# Patient Record
Sex: Male | Born: 1945 | Race: Black or African American | Hispanic: No | Smoking: Former smoker
Health system: Southern US, Community
[De-identification: ages and names within clinical notes are randomized; demographics above are authoritative.]

## PROBLEM LIST (undated history)

## (undated) DIAGNOSIS — E559 Vitamin D deficiency, unspecified: Secondary | ICD-10-CM

## (undated) DIAGNOSIS — E119 Type 2 diabetes mellitus without complications: Secondary | ICD-10-CM

## (undated) DIAGNOSIS — N410 Acute prostatitis: Secondary | ICD-10-CM

## (undated) DIAGNOSIS — I10 Essential (primary) hypertension: Secondary | ICD-10-CM

## (undated) DIAGNOSIS — R369 Urethral discharge, unspecified: Secondary | ICD-10-CM

## (undated) DIAGNOSIS — F419 Anxiety disorder, unspecified: Secondary | ICD-10-CM

## (undated) HISTORY — DX: Type 2 diabetes mellitus without complications: E11.9

## (undated) HISTORY — DX: Essential (primary) hypertension: I10

## (undated) HISTORY — DX: Anxiety disorder, unspecified: F41.9

## (undated) HISTORY — DX: Urethral discharge, unspecified: R36.9

## (undated) HISTORY — DX: Acute prostatitis: N41.0

## (undated) HISTORY — PX: WISDOM TOOTH EXTRACTION: SHX21

## (undated) HISTORY — PX: ROTATOR CUFF REPAIR: SHX139

## (undated) HISTORY — DX: Vitamin D deficiency, unspecified: E55.9

---

## 1999-06-18 ENCOUNTER — Encounter: Payer: Self-pay | Admitting: Internal Medicine

## 1999-06-18 ENCOUNTER — Encounter: Admission: RE | Admit: 1999-06-18 | Discharge: 1999-06-18 | Payer: Self-pay | Admitting: Internal Medicine

## 2000-03-08 ENCOUNTER — Encounter: Admission: RE | Admit: 2000-03-08 | Discharge: 2000-03-08 | Payer: Self-pay | Admitting: Internal Medicine

## 2000-03-08 ENCOUNTER — Encounter: Payer: Self-pay | Admitting: Internal Medicine

## 2002-02-13 ENCOUNTER — Encounter: Payer: Self-pay | Admitting: Internal Medicine

## 2002-02-13 ENCOUNTER — Encounter: Admission: RE | Admit: 2002-02-13 | Discharge: 2002-02-13 | Payer: Self-pay | Admitting: Internal Medicine

## 2004-10-26 ENCOUNTER — Encounter (INDEPENDENT_AMBULATORY_CARE_PROVIDER_SITE_OTHER): Payer: Self-pay | Admitting: *Deleted

## 2004-10-26 ENCOUNTER — Ambulatory Visit (HOSPITAL_BASED_OUTPATIENT_CLINIC_OR_DEPARTMENT_OTHER): Admission: RE | Admit: 2004-10-26 | Discharge: 2004-10-26 | Payer: Self-pay | Admitting: Otolaryngology

## 2004-10-26 ENCOUNTER — Ambulatory Visit (HOSPITAL_COMMUNITY): Admission: RE | Admit: 2004-10-26 | Discharge: 2004-10-26 | Payer: Self-pay | Admitting: Otolaryngology

## 2008-03-24 ENCOUNTER — Inpatient Hospital Stay (HOSPITAL_COMMUNITY): Admission: EM | Admit: 2008-03-24 | Discharge: 2008-03-25 | Payer: Self-pay | Admitting: Emergency Medicine

## 2010-11-27 ENCOUNTER — Encounter: Payer: Self-pay | Admitting: Internal Medicine

## 2011-01-01 NOTE — Op Note (Signed)
NAME:  Jonathan Schultz, LIKINS NO.:  [id number]   MEDICAL RECORD NO.:  [id number]          PATIENT TYPE:  AMB   LOCATION:  DSC                          FACILITY:  [facility]   PHYSICIAN:  Karol T. Lazarus Salines, M.D. 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   PROCEDURE PERFORMED:  Nasal septoplasty, bilateral submucous resection of  inferior turbinates, left maxillary endoscopic antrostomy with removal of  sinus contents.   SURGEON:  Gloris Manchester. Lazarus Salines, M.D.   ANESTHESIA:  General orotracheal.   BLOOD LOSS:  Minimal.   COMPLICATIONS:  None.   FINDINGS:  Overall thick corrugated septum deviated towards the left. Very  large bony inferior turbinates. A large thin-walled serous retention cyst in  the left antrum.   PROCEDURE:  With the patient in the comfortable supine position, general  orotracheal anesthesia was induced without difficulty. At an appropriate  level, the patient was placed in a slight sitting position. A saline  moistened throat pack was placed. Nasal vibrissae were trimmed. Cocaine  crystals, 200 milligrams total were applied on cotton carriers to the  anterior ethmoid and sphenopalatine ganglion regions on both sides.  Xylocaine plus phenylephrine solution was applied on 1/2 x 3 inch cottonoids  to both sides of the septum. 1% Xylocaine with 1:100,000 epinephrine, 8 cc  total was infiltrated into the anterior floor of the nose on both sides,  into the nasal spine region, into the membranous columella, and into the  submucoperichondrial plane of the septum on both sides. A sterile  preparation and draping of the midface  was accomplished.   The CT scans were reviewed. The materials were removed from the nose,  observed to be intact and correct in number. Findings were as described  above. A small floor incision was executed on the left side and a floor  tunnel was developed over towards the vomer and brought forward. On the  right side, a hemitransfixion incision was executed and carried down to the  floor incision. Once again a floor tunnel was elevated and carried over to  the vomer and brought forward. The sub mucoperichondrial plane of the right  septum was elevated, carried back to the perpendicular plate of ethmoid,  carried  inferiorly to communicate with the floor tunnel and then brought  forward. The right septal flap had a small inferior rent but otherwise was  raised intact.   The chondroethmoid junction was identified and opened with the Cottle  elevator. The opposite submucoperiosteal plane was dissected. The superior  perpendicular plate was lysed with an open Jansen-Middleton forceps. The  inferior portion was dissected and rocked free and delivered. Additional  bony spicules were carefully removed. At this point, the posterior inferior  corner of the quadrangular cartilage was submucosally resected and a 2 mm  strip  along the maxillary crest where it was heavily spurred was also  resected, allowing it to trapdoor into the midline.  It tended to veer  towards the left. An incision separating the septum from the upper lateral  cartilages was made on both sides of the nose.  At this point the septum was  mobile and in the midline. Hemostasis was observed. Septum was secured to  the nasal spine with a figure-of-eight 4-0 PDS suture. The tunnels were  carefully suctioned free. The incisions were closed with interrupted 4-0  chromic sutures.   Upon completing the septoplasty, the lateral wall of the middle meatus and  the middle turbinate on the left side were infiltrated with a small amount   of 1% Xylocaine with 1:100,000 epinephrine using a 25 gauge spinal needle.  Another 6 cc of the same agent was used to infiltrate the inferior  turbinates on each side.   The middle turbinate was medialized. Inspection with the 0 degree endoscope  revealed the findings as described above. Sickle knife was used to incise  the base of the uncinate process which was then removed with the power  debrider. An accessory ostium was observed and this was enlarged in a  posterior direction using a Gruenwald forceps and then in an anterior  direction using the backbiting forceps. The flag forceps was used to clean  the inferior edge of the antrostomy. The debrider was used to remove loose  pieces. What appeared to be a punctured cyst was identified in the antrum  and as much of the cyst wall as possible was evacuated using the debrider,  flag forceps, goose-necked forceps, and the curved suction tip. A 30 degrees  endoscope was used to better visualize the antrum. No other findings were  noted in the antrum. Hemostasis was observed. The mucosal and bony edges  were cleaned up and this was completed.   Beginning on the right side, the anterior hood of the inferior turbinate was  sharply lysed just behind the nasal valve region. The medial mucosa of the  turbinate was incised in an anterior upsloping fashion and a laterally based  flap was developed. The turbinate was infractured. Using angled turbinate  scissors, the turbinate bone and lateral mucosa were resected in a posterior  downsloping fashion taking virtually all of the anterior pole and leaving  virtually all the posterior pole. Additional slivers of bone were carefully  submucosally dissected and removed to allow more prompt healing. The bulbous  posterior pole of the inferior turbinate and cut mucosal edges were suction coagulated. The turbinate was outfractured. The flap was laid back down and  this side was completed. The left side was  done in identical fashion.   At this point,  0.040 reinforced Silastic splints were fashioned and placed  against the septum and secured thereto with a 3-0 nylon stitch. The triple  thickness Telfa pack impregnated with bacitracin ointment and with a 2-0  silk tag was placed into the left middle meatus for hemostasis. Similar  triple thickness Telfa packs were placed along the inferior turbinates on  each side.   The pharynx was suctioned free and throat pack was removed. Hemostasis was  observed. The patient was returned to Anesthesia, awakened, extubated, and  transferred to recovery in stable condition.   COMMENT:  A 65 year old black male with a distant history of nasal trauma  and long history of poor breathing through his nose with additional history  of recurrent left maxillary sinus infections  and persistent opacification  consistent with a maxiallry sinus cyst was the indication for today's  procedure. Anticipate routine postoperative  recovery with attention to ice, elevation, analgesia, antibiosis. Will  remove the nasal packs in one day and begin nasal hygiene measures. Will  remove the nasal splints in 10 days. Given low anticipated risk of post  anesthetic or postsurgical complications I feel an outpatient venue is  appropriate.      KTW/MEDQ  D:  10/26/2004  T:  10/26/2004  Job:  [id number]   cc:   Minerva Areola L. August Saucer, M.D.  [address] [address]  [place]  [place] [address]  Fax: [phone]

## 2011-01-01 NOTE — Discharge Summary (Signed)
NAME:  Jonathan Schultz, Jonathan Schultz NO.:  [id number]   MEDICAL RECORD NO.:  [id number]          PATIENT TYPE:  INP   LOCATION:  4729                         FACILITY:  [facility]   PHYSICIAN:  Ricki Rodriguez, M.D.  DATE OF BIRTH:  20-May-1946   DATE OF ADMISSION:  03/24/2008  DATE OF DISCHARGE:  03/25/2008                               DISCHARGE SUMMARY   Patient referred by Dr. Willey Blade.   HOSPITAL LOCATION:  4729, bed 2.   FINAL DIAGNOSES:  1. Chest pain.  2. Hypertension.  3. Hyperlipidemia.  4. Diabetes mellitus type 2.  5. Anxiety.   PRINCIPAL PROCEDURE:  Left heart catheterization, selective coronary  angiography done by Dr. Orpah Cobb on March 25, 2008.   DISCHARGE MEDICATIONS:  1. Norvasc 5 mg one daily.  2. Metoprolol 25 mg one twice daily.  3. Aspirin 81 mg one daily.  4. Prilosec 20 mg one daily.  5. The patient to hold back Glucophage for 2 days.   DISCHARGE DIET:  Low-sodium, heart-healthy diet with a medium-calorie,  carbohydrate-modified diet.   DISCHARGE ACTIVITY:  The patient to increase activity slowly and to  avoid any activity that causes chest pain, shortness of breath,  dizziness, sweating, or excessive weakness.  The patient to bring all  medications from home on followup by Dr. Orpah Cobb in 1-2 weeks and  by Dr. Willey Blade in 3-4 weeks.   HISTORY:  This 65 year old black male complained of 2-day history of  chest pain retrosternal associated with shortness of breath increased  with activity with history of hypertension.  No history of diabetes,  smoking, alcohol intake, hyperlipidemia, or obesity.   PHYSICAL EXAMINATION:  VITAL SIGNS:  Temperature 98.4, pulse 71,  respirations 18, blood pressure 125/64.  GENERAL:  The patient is a well-built, well-nourished black male in no  distress.  HEENT: The patient has brown eyes.  Conjunctivae pink.  Sclerae white.  NECK:  No JVD.  LUNGS:  Clear.  Chest wall nontender.  HEART:  Normal S1 and  S2.  ABDOMEN:  Soft.  EXTREMITIES:  No edema, cyanosis, clubbing with bilateral lower hand  tenderness.  SKIN:  Warm and dry.  NEUROLOGIC:  The patient moves all four extremities.   LABORATORY DATA:  Normal hemoglobin, hematocrit, WBC count, platelet  count, normal electrolytes, BUN, creatinine.  EKG sinus rhythm with  inferior T-wave changes.  Cardiac enzymes negative x3.  Cholesterol  borderline at 101.  LDL and HDL 37.   EKG sinus rhythm with inferior ischemia.   Cardiac catheterization showed normal coronaries and normal LV systolic  function.   HOSPITAL COURSE:  The patient was admitted to Telemetry Unit, myocardial  infarction was ruled out.  He underwent cardiac catheterization that  showed normal coronaries and normal LV systolic function and his  medications were adjusted.  He was advised to bring all his home  medications, and he was discharged home in satisfactory condition with  follow up by me and by primary care physician.      Ricki Rodriguez, M.D.  Electronically Signed     ASK/MEDQ  D:  05/15/2008  T:  05/16/2008  Job:  [id number]

## 2011-01-14 ENCOUNTER — Encounter: Payer: Self-pay | Admitting: Internal Medicine

## 2011-05-14 LAB — DIFFERENTIAL
Basophils Relative: 0
Eosinophils Absolute: 0
Eosinophils Relative: 0
Lymphs Abs: 1.4
Monocytes Relative: 10

## 2011-05-14 LAB — COMPREHENSIVE METABOLIC PANEL
ALT: 11
Albumin: 3.7
Alkaline Phosphatase: 40
Calcium: 9.4
Glucose, Bld: 101 — ABNORMAL HIGH
Potassium: 4.6
Sodium: 138
Total Protein: 6.6

## 2011-05-14 LAB — APTT: aPTT: 32

## 2011-05-14 LAB — POCT I-STAT, CHEM 8
BUN: 16
Calcium, Ion: 1.18
Chloride: 103
Creatinine, Ser: 1.3
Glucose, Bld: 103 — ABNORMAL HIGH
HCT: 42
Hemoglobin: 14.3
Potassium: 4.9
Sodium: 139
TCO2: 32

## 2011-05-14 LAB — CBC
HCT: 40.7
MCHC: 32.6
MCHC: 32.8
MCV: 83
Platelets: 252
RBC: 4.91
RDW: 14.2
WBC: 6.5

## 2011-05-14 LAB — CK TOTAL AND CKMB (NOT AT ARMC)
CK, MB: 0.8
Relative Index: 0.6
Total CK: 129

## 2011-05-14 LAB — PROTIME-INR
INR: 1
Prothrombin Time: 13.7

## 2011-05-14 LAB — LIPID PANEL
Cholesterol: 163
HDL: 37 — ABNORMAL LOW
LDL Cholesterol: 101 — ABNORMAL HIGH
Total CHOL/HDL Ratio: 4.4
Triglycerides: 124
VLDL: 25

## 2011-05-14 LAB — CARDIAC PANEL(CRET KIN+CKTOT+MB+TROPI)
Total CK: 112
Troponin I: 0.01
Troponin I: 0.01

## 2011-05-14 LAB — POCT CARDIAC MARKERS
CKMB, poc: 2.4
Myoglobin, poc: 75
Troponin i, poc: <0.05

## 2011-05-14 LAB — HEPARIN LEVEL (UNFRACTIONATED): Heparin Unfractionated: 0.97 — ABNORMAL HIGH

## 2011-05-14 LAB — HEMOGLOBIN A1C
Hgb A1c MFr Bld: 7.5 — ABNORMAL HIGH
Mean Plasma Glucose: 190

## 2012-02-25 LAB — CBC AND DIFFERENTIAL
Hemoglobin: 13.6 g/dL (ref 13.5–17.5)
WBC: 5.8 10^3/mL

## 2012-05-26 LAB — HEPATIC FUNCTION PANEL
ALT: 18 U/L (ref 10–40)
Alkaline Phosphatase: 56 U/L (ref 25–125)

## 2012-05-26 LAB — BASIC METABOLIC PANEL
Creatinine: 1.1 mg/dL (ref 0.6–1.3)
Potassium: 4.8 mmol/L (ref 3.4–5.3)
Sodium: 140 mmol/L (ref 137–147)

## 2012-06-02 ENCOUNTER — Other Ambulatory Visit: Payer: Self-pay | Admitting: Internal Medicine

## 2012-06-02 ENCOUNTER — Ambulatory Visit
Admission: RE | Admit: 2012-06-02 | Discharge: 2012-06-02 | Disposition: A | Discharge status: Home / Self Care | Payer: Medicare Other | Source: Ambulatory Visit | Attending: Internal Medicine | Admitting: Internal Medicine

## 2012-06-02 DIAGNOSIS — R52 Pain, unspecified: Secondary | ICD-10-CM

## 2012-08-08 ENCOUNTER — Encounter (HOSPITAL_COMMUNITY): Payer: Self-pay | Admitting: *Deleted

## 2012-08-08 ENCOUNTER — Emergency Department (HOSPITAL_COMMUNITY)
Admission: EM | Admit: 2012-08-08 | Discharge: 2012-08-08 | Disposition: A | Discharge status: Home / Self Care | Payer: Medicare Other | Attending: Emergency Medicine | Admitting: Emergency Medicine

## 2012-08-08 DIAGNOSIS — E119 Type 2 diabetes mellitus without complications: Secondary | ICD-10-CM | POA: Insufficient documentation

## 2012-08-08 DIAGNOSIS — H1130 Conjunctival hemorrhage, unspecified eye: Secondary | ICD-10-CM | POA: Insufficient documentation

## 2012-08-08 DIAGNOSIS — I10 Essential (primary) hypertension: Secondary | ICD-10-CM | POA: Insufficient documentation

## 2012-08-08 DIAGNOSIS — R51 Headache: Secondary | ICD-10-CM | POA: Insufficient documentation

## 2012-08-08 DIAGNOSIS — Z87448 Personal history of other diseases of urinary system: Secondary | ICD-10-CM | POA: Insufficient documentation

## 2012-08-08 DIAGNOSIS — E7800 Pure hypercholesterolemia, unspecified: Secondary | ICD-10-CM | POA: Insufficient documentation

## 2012-08-08 DIAGNOSIS — Z79899 Other long term (current) drug therapy: Secondary | ICD-10-CM | POA: Insufficient documentation

## 2012-08-08 DIAGNOSIS — Z8639 Personal history of other endocrine, nutritional and metabolic disease: Secondary | ICD-10-CM | POA: Insufficient documentation

## 2012-08-08 DIAGNOSIS — N410 Acute prostatitis: Secondary | ICD-10-CM | POA: Insufficient documentation

## 2012-08-08 DIAGNOSIS — Z862 Personal history of diseases of the blood and blood-forming organs and certain disorders involving the immune mechanism: Secondary | ICD-10-CM | POA: Insufficient documentation

## 2012-08-08 DIAGNOSIS — Z7982 Long term (current) use of aspirin: Secondary | ICD-10-CM | POA: Insufficient documentation

## 2012-08-08 MED ORDER — FLUORESCEIN SODIUM 1 MG OP STRP
1.0000 | ORAL_STRIP | Freq: Once | OPHTHALMIC | Status: AC
  Administered 2012-08-08: 1 via OPHTHALMIC
  Filled 2012-08-08: qty 1

## 2012-08-08 MED ORDER — PROPARACAINE HCL 0.5 % OP SOLN
1.0000 [drp] | Freq: Once | OPHTHALMIC | Status: AC
  Administered 2012-08-08: 1 [drp] via OPHTHALMIC
  Filled 2012-08-08: qty 15

## 2012-08-08 NOTE — ED Provider Notes (Signed)
History     CSN: [id number]  Arrival date & time 08/08/12  1204   First MD Initiated Contact with Patient 08/08/12 1308      Chief Complaint  Patient presents with  . Eye Problem  . Headache    (Consider location/radiation/quality/duration/timing/severity/associated sxs/prior treatment) HPI  66 year old male presents with blood in L eye.  Pt reports he woke up this morning and when looking in the mirror he notices that his L eye is blood shot.  This never happened before.  No pain or pressure with eye, no vision changes, no itching, no fb sensation.  When asked if he has a headache, he report a very mild headache to forehead without significant discomfort.  Denies neck pain, sneeze, cough, and denies recent trauma.   Past Medical History  Diagnosis Date  . Acute prostatitis   . Type II or unspecified type diabetes mellitus without mention of complication, not stated as uncontrolled   . Essential hypertension, malignant   . Pure hypercholesterolemia   . Unspecified vitamin D deficiency   . Unspecified hypothyroidism   . Urethral discharge     Past Surgical History  Procedure Date  . Rotator cuff repair   . Wisdom tooth extraction     Family History  Problem Relation Age of Onset  . Diabetes Mother   . Hyperlipidemia Mother     History  Substance Use Topics  . Smoking status: Never Smoker   . Smokeless tobacco: Not on file  . Alcohol Use: No      Review of Systems  Constitutional: Negative for fever and chills.  HENT: Negative for neck pain.   Eyes: Positive for redness. Negative for photophobia, pain, discharge, itching and visual disturbance.  Skin: Negative for rash.  Neurological: Negative for numbness.    Allergies  Penicillins  Home Medications   Current Outpatient Rx  Name  Route  Sig  Dispense  Refill  . ALPRAZOLAM 0.5 MG PO TABS   Oral   Take 0.5 mg by mouth 3 (three) times daily.           . ASPIRIN 81 MG PO TABS   Oral   Take 81 mg  by mouth daily.           Marland Kitchen HYDROXYZINE HCL 25 MG PO TABS   Oral   Take 25 mg by mouth at bedtime.         Marland Kitchen METOCLOPRAMIDE HCL 5 MG PO TABS   Oral   Take 5 mg by mouth 2 (two) times daily.          Marland Kitchen PIROXICAM 20 MG PO CAPS   Oral   Take 20 mg by mouth 2 (two) times daily. 1/2 tablet         . KOMBIGLYZE XR PO   Oral   Take 2.5 mg by mouth daily.            BP 137/63  Pulse 66  Temp 97.4 F (36.3 C) (Oral)  Resp 14  SpO2 100%  Physical Exam  Nursing note and vitals reviewed. Constitutional: He is oriented to person, place, and time. He appears well-developed and well-nourished. No distress.  HENT:  Head: Normocephalic and atraumatic.  Right Ear: External ear normal.  Left Ear: External ear normal.  Mouth/Throat: No oropharyngeal exudate.  Eyes: EOM and lids are normal. Pupils are equal, round, and reactive to light. No foreign bodies found. Right eye exhibits no chemosis, no discharge, no exudate and  no hordeolum. No foreign body present in the right eye. Left eye exhibits no chemosis, no discharge, no exudate and no hordeolum. No foreign body present in the left eye. Right conjunctiva is not injected. Right conjunctiva has no hemorrhage. Left conjunctiva is not injected. Left conjunctiva has a hemorrhage. No scleral icterus.  Slit lamp exam:      The left eye shows no corneal abrasion, no corneal flare, no corneal ulcer, no foreign body, no hyphema, no hypopyon, no fluorescein uptake and no anterior chamber bulge.  Neck: Normal range of motion. Neck supple.  Neurological: He is alert and oriented to person, place, and time.  Skin: Skin is warm. No rash noted.  Psychiatric: He has a normal mood and affect.    ED Course  Procedures (including critical care time)  Labs Reviewed - No data to display No results found.   No diagnosis found.  1. Subconjunctival hemorrhage, Left  MDM  Pt with spontaneous non traumatic subconjunctival hemorrhage of L eye,  limbic sparing.  No chemosis.  No vision changes, no pain. Pt has normal BP, not taking any blood thinner medication except baby aspirin. Reassurance given.  Opthalmology referral as needed.    BP 137/63  Pulse 66  Temp 97.4 F (36.3 C) (Oral)  Resp 14  SpO2 100%      Fayrene Helper, PA-C 08/08/12 1351

## 2012-08-08 NOTE — ED Provider Notes (Signed)
Medical screening examination/treatment/procedure(s) were performed by non-physician practitioner and as supervising physician I was immediately available for consultation/collaboration.  Ethelda Chick, MD 08/08/12 1414

## 2012-08-08 NOTE — ED Notes (Signed)
Pt reports blood in L eye sclera. Noticed it just prior to arrival. Denies eye pain or vision changes. Also c/o slight headache just above L eye. Hx HTN, has taken meds today.

## 2012-10-09 ENCOUNTER — Encounter: Payer: Self-pay | Admitting: Hematology

## 2013-10-08 ENCOUNTER — Emergency Department (HOSPITAL_COMMUNITY)
Admission: EM | Admit: 2013-10-08 | Discharge: 2013-10-08 | Disposition: A | Discharge status: Home / Self Care | Payer: Medicare Other | Attending: Emergency Medicine | Admitting: Emergency Medicine

## 2013-10-08 ENCOUNTER — Emergency Department (HOSPITAL_COMMUNITY): Payer: Medicare Other

## 2013-10-08 ENCOUNTER — Encounter (HOSPITAL_COMMUNITY): Payer: Self-pay | Admitting: Emergency Medicine

## 2013-10-08 DIAGNOSIS — Z79899 Other long term (current) drug therapy: Secondary | ICD-10-CM | POA: Insufficient documentation

## 2013-10-08 DIAGNOSIS — I10 Essential (primary) hypertension: Secondary | ICD-10-CM | POA: Insufficient documentation

## 2013-10-08 DIAGNOSIS — J069 Acute upper respiratory infection, unspecified: Secondary | ICD-10-CM

## 2013-10-08 DIAGNOSIS — E119 Type 2 diabetes mellitus without complications: Secondary | ICD-10-CM | POA: Insufficient documentation

## 2013-10-08 DIAGNOSIS — R05 Cough: Secondary | ICD-10-CM

## 2013-10-08 DIAGNOSIS — F411 Generalized anxiety disorder: Secondary | ICD-10-CM | POA: Insufficient documentation

## 2013-10-08 DIAGNOSIS — Z7982 Long term (current) use of aspirin: Secondary | ICD-10-CM | POA: Insufficient documentation

## 2013-10-08 DIAGNOSIS — R059 Cough, unspecified: Secondary | ICD-10-CM

## 2013-10-08 DIAGNOSIS — Z87448 Personal history of other diseases of urinary system: Secondary | ICD-10-CM | POA: Insufficient documentation

## 2013-10-08 DIAGNOSIS — Z880 Allergy status to penicillin: Secondary | ICD-10-CM | POA: Insufficient documentation

## 2013-10-08 LAB — CBG MONITORING, ED: Glucose-Capillary: 146 mg/dL — ABNORMAL HIGH (ref 70–99)

## 2013-10-08 MED ORDER — AZITHROMYCIN 250 MG PO TABS
ORAL_TABLET | ORAL | Status: DC
Start: 2013-10-08 — End: 2013-10-18

## 2013-10-08 MED ORDER — GUAIFENESIN ER 600 MG PO TB12: 600.0000 mg | ORAL_TABLET | Freq: Two times a day (BID) | ORAL | Status: DC

## 2013-10-08 NOTE — ED Notes (Signed)
Pt states he has had cold sxs for about 3 weeks and has been treating it with OTC medication and still feels sick  Pt states tonight he woke up feeling cold so he turned the heat up and is still cold  Pt states he is achy and his throat is sore and he feels weak

## 2013-10-08 NOTE — ED Provider Notes (Signed)
Medical screening examination/treatment/procedure(s) were performed by non-physician practitioner and as supervising physician I was immediately available for consultation/collaboration.  EKG Interpretation   None         Lyanne CoKevin M , MD 10/08/13 [phone]

## 2013-10-08 NOTE — ED Provider Notes (Signed)
CSN: [id number]     Arrival date & time 10/08/13  [id number] History   First MD Initiated Contact with Patient 10/08/13 0404     Chief Complaint  Patient presents with  . URI   HPI  History provided by the patient. The patient is a 68 year old male with history of diabetes, hypertension hypercholesterolemia who presents with complaints of persistent cough and congestion symptoms. Patient states he has had cough and congestion for the past 3 weeks. He has been taking DayQuil every other day to help with his symptoms but reports no change. Last night and early this morning patient reports having sudden chills. He continues to feel very fatigued without any improvement of symptoms. He occasionally has some chest discomfort at that is productive of yellow phlegm. He denies any shortness of breath. He isn't sure about any fever. Denies any vomiting or diarrhea. Denies any specific sick contacts. No other aggravating or alleviating factors. No other associated symptoms.    Past Medical History  Diagnosis Date  . Acute prostatitis   . Type II or unspecified type diabetes mellitus without mention of complication, not stated as uncontrolled   . Essential hypertension, malignant   . Pure hypercholesterolemia   . Unspecified vitamin D deficiency   . Urethral discharge   . Anxiety    Past Surgical History  Procedure Laterality Date  . Rotator cuff repair    . Wisdom tooth extraction     Family History  Problem Relation Age of Onset  . Diabetes Mother   . Hyperlipidemia Mother    History  Substance Use Topics  . Smoking status: Never Smoker   . Smokeless tobacco: Not on file  . Alcohol Use: No    Review of Systems  Constitutional: Positive for chills. Negative for fever.  HENT: Positive for congestion, rhinorrhea and sore throat.   Respiratory: Positive for cough. Negative for shortness of breath.   Cardiovascular: Negative for chest pain.  Gastrointestinal: Negative for nausea, vomiting,  abdominal pain and diarrhea.  All other systems reviewed and are negative.      Allergies  Penicillins  Home Medications   Current Outpatient Rx  Name  Route  Sig  Dispense  Refill  . ALPRAZolam (XANAX) 0.5 MG tablet   Oral   Take 0.5 mg by mouth 3 (three) times daily.           Marland Kitchen. amLODipine (NORVASC) 5 MG tablet   Oral   Take 5 mg by mouth every morning.          Marland Kitchen. aspirin 81 MG tablet   Oral   Take 81 mg by mouth every morning.          Marland Kitchen. azithromycin (ZITHROMAX) 250 MG tablet   Oral   Take 1-2 tablets by mouth See admin instructions. Take 2 tablets on day 1. Take 1 tablet daily for the next 4 days         . hydrOXYzine (ATARAX/VISTARIL) 10 MG tablet   Oral   Take 10 mg by mouth at bedtime as needed for itching.         Marland Kitchen. LEVEMIR FLEXTOUCH 100 UNIT/ML Pen               . metoprolol (LOPRESSOR) 50 MG tablet   Oral   Take 25 mg by mouth 2 (two) times daily.         Marland Kitchen. PARoxetine (PAXIL) 20 MG tablet   Oral   Take 10 mg by mouth 2 (  two) times daily.         . Saxagliptin-Metformin (KOMBIGLYZE XR) 2.12-998 MG TB24   Oral   Take 1 tablet by mouth every morning.          BP 158/72  Pulse 103  Temp(Src) 98.7 F (37.1 C) (Oral)  Resp 18  Ht 5\' 9"  (1.753 m)  Wt 182 lb (82.555 kg)  BMI 26.86 kg/m2  SpO2 99% Physical Exam  Nursing note and vitals reviewed. Constitutional: He is oriented to person, place, and time. He appears well-developed and well-nourished. No distress.  HENT:  Head: Normocephalic.  Right Ear: Tympanic membrane normal.  Left Ear: Tympanic membrane normal.  Mouth/Throat: Oropharynx is clear and moist.  Neck: Normal range of motion. Neck supple. No JVD present.  No meningeal signs  Cardiovascular: Normal rate and regular rhythm.   Pulmonary/Chest: Effort normal and breath sounds normal. No respiratory distress. He has no wheezes. He has no rales.  Abdominal: Soft. There is no tenderness. There is no rebound and no  guarding.  Musculoskeletal: Normal range of motion. He exhibits no edema and no tenderness.  Neurological: He is alert and oriented to person, place, and time.  Skin: Skin is warm.  Psychiatric: He has a normal mood and affect. His behavior is normal.    ED Course  Procedures   DIAGNOSTIC STUDIES: Oxygen Saturation is 99% on room air.    COORDINATION OF CARE:  Nursing notes reviewed. Vital signs reviewed. Initial pt interview and examination performed.   5:00 AM-patient seen and evaluated. Patient well-appearing no acute distress. Normal respirations O2 sats. No signs of respiratory distress.   Chest x-ray reviewed. No clear signs for pneumonia.    Imaging Review Dg Chest 2 View  10/08/2013   CLINICAL DATA:  Fever and cough.  EXAM: CHEST  2 VIEW  COMPARISON:  Chest radiograph performed 03/24/2008  FINDINGS: The lungs are well-aerated and clear. There is no evidence of focal opacification, pleural effusion or pneumothorax.  The heart is normal in size; the mediastinal contour is within normal limits. No acute osseous abnormalities are seen.  IMPRESSION: No acute cardiopulmonary process seen.   Electronically Signed   By: Roanna Raider M.D.   On: 10/08/2013 04:51      MDM   Final diagnoses:  Cough  URI (upper respiratory infection)         Angus Seller, PA-C 10/08/13 [phone]

## 2013-10-08 NOTE — Discharge Instructions (Signed)
Please use the medications prescribed to help with your cough and congestion symptoms. Followup with a primary care provider as planned for continued evaluation and treatment.    Cough, Adult  A cough is a reflex that helps clear your throat and airways. It can help heal the body or may be a reaction to an irritated airway. A cough may only last 2 or 3 weeks (acute) or may last more than 8 weeks (chronic).  CAUSES Acute cough:  Viral or bacterial infections. Chronic cough:  Infections.  Allergies.  Asthma.  Post-nasal drip.  Smoking.  Heartburn or acid reflux.  Some medicines.  Chronic lung problems (COPD).  Cancer. SYMPTOMS   Cough.  Fever.  Chest pain.  Increased breathing rate.  High-pitched whistling sound when breathing (wheezing).  Colored mucus that you cough up (sputum). TREATMENT   A bacterial cough may be treated with antibiotic medicine.  A viral cough must run its course and will not respond to antibiotics.  Your caregiver may recommend other treatments if you have a chronic cough. HOME CARE INSTRUCTIONS   Only take over-the-counter or prescription medicines for pain, discomfort, or fever as directed by your caregiver. Use cough suppressants only as directed by your caregiver.  Use a cold steam vaporizer or humidifier in your bedroom or home to help loosen secretions.  Sleep in a semi-upright position if your cough is worse at night.  Rest as needed.  Stop smoking if you smoke. SEEK IMMEDIATE MEDICAL CARE IF:   You have pus in your sputum.  Your cough starts to worsen.  You cannot control your cough with suppressants and are losing sleep.  You begin coughing up blood.  You have difficulty breathing.  You develop pain which is getting worse or is uncontrolled with medicine.  You have a fever. MAKE SURE YOU:   Understand these instructions.  Will watch your condition.  Will get help right away if you are not doing well or get  worse. Document Released: 01/29/2011 Document Revised: 10/25/2011 Document Reviewed: 01/29/2011 [facility] Patient Information 2014 [place], [place].    Upper Respiratory Infection, Adult An upper respiratory infection (URI) is also known as the common cold. It is often caused by a type of germ (virus). Colds are easily spread (contagious). You can pass it to others by kissing, coughing, sneezing, or drinking out of the same glass. Usually, you get better in 1 or 2 weeks.  HOME CARE   Only take medicine as told by your doctor.  Use a warm mist humidifier or breathe in steam from a hot shower.  Drink enough water and fluids to keep your pee (urine) clear or pale yellow.  Get plenty of rest.  Return to work when your temperature is back to normal or as told by your doctor. You may use a face mask and wash your hands to stop your cold from spreading. GET HELP RIGHT AWAY IF:   After the first few days, you feel you are getting worse.  You have questions about your medicine.  You have chills, shortness of breath, or brown or red spit (mucus).  You have yellow or brown snot (nasal discharge) or pain in the face, especially when you bend forward.  You have a fever, puffy (swollen) neck, pain when you swallow, or white spots in the back of your throat.  You have a bad headache, ear pain, sinus pain, or chest pain.  You have a high-pitched whistling sound when you breathe in and out (wheezing).  You have a lasting cough or cough up blood.  You have sore muscles or a stiff neck. MAKE SURE YOU:   Understand these instructions.  Will watch your condition.  Will get help right away if you are not doing well or get worse. Document Released: 01/19/2008 Document Revised: 10/25/2011 Document Reviewed: 12/07/2010 [facility] Patient Information 2014 [place], [place].

## 2013-10-18 ENCOUNTER — Emergency Department (HOSPITAL_COMMUNITY)
Admission: EM | Admit: 2013-10-18 | Discharge: 2013-10-18 | Disposition: A | Discharge status: Home / Self Care | Payer: Medicare Other | Attending: Emergency Medicine | Admitting: Emergency Medicine

## 2013-10-18 ENCOUNTER — Encounter (HOSPITAL_COMMUNITY): Payer: Self-pay | Admitting: Emergency Medicine

## 2013-10-18 DIAGNOSIS — Z7982 Long term (current) use of aspirin: Secondary | ICD-10-CM | POA: Insufficient documentation

## 2013-10-18 DIAGNOSIS — Z8639 Personal history of other endocrine, nutritional and metabolic disease: Secondary | ICD-10-CM | POA: Insufficient documentation

## 2013-10-18 DIAGNOSIS — Z87448 Personal history of other diseases of urinary system: Secondary | ICD-10-CM | POA: Insufficient documentation

## 2013-10-18 DIAGNOSIS — Z794 Long term (current) use of insulin: Secondary | ICD-10-CM | POA: Insufficient documentation

## 2013-10-18 DIAGNOSIS — Z880 Allergy status to penicillin: Secondary | ICD-10-CM | POA: Insufficient documentation

## 2013-10-18 DIAGNOSIS — E119 Type 2 diabetes mellitus without complications: Secondary | ICD-10-CM | POA: Insufficient documentation

## 2013-10-18 DIAGNOSIS — I10 Essential (primary) hypertension: Secondary | ICD-10-CM | POA: Insufficient documentation

## 2013-10-18 DIAGNOSIS — J069 Acute upper respiratory infection, unspecified: Secondary | ICD-10-CM | POA: Insufficient documentation

## 2013-10-18 DIAGNOSIS — F411 Generalized anxiety disorder: Secondary | ICD-10-CM | POA: Insufficient documentation

## 2013-10-18 DIAGNOSIS — Z792 Long term (current) use of antibiotics: Secondary | ICD-10-CM | POA: Insufficient documentation

## 2013-10-18 DIAGNOSIS — Z79899 Other long term (current) drug therapy: Secondary | ICD-10-CM | POA: Insufficient documentation

## 2013-10-18 MED ORDER — HYDROCOD POLST-CHLORPHEN POLST 10-8 MG/5ML PO LQCR: 5.0000 mL | Freq: Every evening | ORAL | Status: DC | PRN

## 2013-10-18 MED ORDER — FLUTICASONE PROPIONATE 50 MCG/ACT NA SUSP: 2.0000 | Freq: Every day | NASAL | Status: AC

## 2013-10-18 NOTE — ED Notes (Signed)
Per pt, states cold symptoms for weeks-states he was diagnosed with URI by ED and his PCP stated he had a "touch of the flu"-states he woke up with cold sweats-states he has been unable to shake his symptoms

## 2013-10-18 NOTE — ED Notes (Signed)
NP advised of apical pulse of 52

## 2013-10-18 NOTE — Discharge Instructions (Signed)
Upper Respiratory Infection, Adult An upper respiratory infection (URI) is also known as the common cold. It is often caused by a type of germ (virus). Colds are easily spread (contagious). You can pass it to others by kissing, coughing, sneezing, or drinking out of the same glass. Usually, you get better in 1 or 2 weeks.  HOME CARE   Only take medicine as told by your doctor.  Use a warm mist humidifier or breathe in steam from a hot shower.  Drink enough water and fluids to keep your pee (urine) clear or pale yellow.  Get plenty of rest.  Return to work when your temperature is back to normal or as told by your doctor. You may use a face mask and wash your hands to stop your cold from spreading. GET HELP RIGHT AWAY IF:   After the first few days, you feel you are getting worse.  You have questions about your medicine.  You have chills, shortness of breath, or brown or red spit (mucus).  You have yellow or brown snot (nasal discharge) or pain in the face, especially when you bend forward.  You have a fever, puffy (swollen) neck, pain when you swallow, or white spots in the back of your throat.  You have a bad headache, ear pain, sinus pain, or chest pain.  You have a high-pitched whistling sound when you breathe in and out (wheezing).  You have a lasting cough or cough up blood.  You have sore muscles or a stiff neck. MAKE SURE YOU:   Understand these instructions.  Will watch your condition.  Will get help right away if you are not doing well or get worse. Document Released: 01/19/2008 Document Revised: 10/25/2011 Document Reviewed: 12/07/2010 [facility] Patient Information 2014 [place], [place].    Use nasal spray as prescribed Return to ENT doctor if symptoms persist Use cough medicine as indicated Return if increased shortness of breath, chest pain or worsening of symptoms

## 2013-10-18 NOTE — ED Provider Notes (Signed)
CSN: [id number]     Arrival date & time 10/18/13  1105 History  This chart was scribed for non-physician practitioner Irish EldersKelly , NP, working with Ward GivensIva L Knapp, MD, by Yevette EdwardsAngela Bracken, ED Scribe. This patient was seen in room WTR6/WTR6 and the patient's care was started at 1:53 PM.   First MD Initiated Contact with Patient 10/18/13 1342     Chief Complaint  Patient presents with  . flu like symptoms     The history is provided by the patient. No language interpreter was used.   HPI Comments: Jonathan LericheGeorge E Schultz is a 68 y.o. male who presents to the Emergency Department complaining of a dry, non-productive cough which began last week but has gradually increased. He also reports associated symptoms including nasal congestion, sinus pressure, a subjective fever, and one episode of night sweats.  In the ED his temperature is 98.2 F. The pt reports he feels like he has had the flu for six weeks; he was treated in the ED two weeks ago and diagnosed with an URI; he was prescribed a Z-pack which he finished. The pt states he has experienced nasal congestion for five weeks. He reports a h/o  sinus surgery by Dr. 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 However, he states the current nasal congestion is similar to the congestion he experienced prior to the surgery.  He uses Dayquil and non-steroidal nasal spray to address the symptoms with minimal relief. The pt denies a sore throat.   Past Medical History  Diagnosis Date  . Acute prostatitis   . Type II or unspecified type diabetes mellitus without mention of complication, not stated as uncontrolled   . Essential hypertension, malignant   . Pure hypercholesterolemia   . Unspecified vitamin D deficiency   . Urethral discharge   . Anxiety    Past Surgical History  Procedure Laterality Date  . Rotator cuff repair    . Wisdom tooth extraction     Family History  Problem Relation Age of Onset  . Diabetes Mother   . Hyperlipidemia Mother    History   Substance Use Topics  . Smoking status: Never Smoker   . Smokeless tobacco: Not on file  . Alcohol Use: No    Review of Systems  Constitutional: Positive for fever and chills.  HENT: Positive for congestion and sinus pressure. Negative for sore throat.   All other systems reviewed and are negative.   Allergies  Penicillins  Home Medications   Current Outpatient Rx  Name  Route  Sig  Dispense  Refill  . acetaminophen (TYLENOL) 500 MG tablet   Oral   Take 1,000 mg by mouth every 8 (eight) hours as needed for mild pain.         Marland Kitchen. ALPRAZolam (XANAX) 0.5 MG tablet   Oral   Take 0.5 mg by mouth 3 (three) times daily.           Marland Kitchen. amLODipine (NORVASC) 5 MG tablet   Oral   Take 5 mg by mouth every morning.          Marland Kitchen. aspirin 81 MG tablet   Oral   Take 81 mg by mouth every morning.          Marland Kitchen. azithromycin (ZITHROMAX Z-PAK) 250 MG tablet      Take 2 tabs PO day 1.  Take 1 tab PO days 2-5.   6 tablet   0   . azithromycin (ZITHROMAX) 250 MG tablet   Oral  Take 1-2 tablets by mouth See admin instructions. Take 2 tablets on day 1. Take 1 tablet daily for the next 4 days         . guaiFENesin (MUCINEX) 600 MG 12 hr tablet   Oral   Take 1 tablet (600 mg total) by mouth 2 (two) times daily.   30 tablet   0   . hydrOXYzine (ATARAX/VISTARIL) 10 MG tablet   Oral   Take 10 mg by mouth at bedtime as needed for itching.         . insulin detemir (LEVEMIR) 100 UNIT/ML injection   Subcutaneous   Inject 14 Units into the skin at bedtime.         . metoprolol (LOPRESSOR) 50 MG tablet   Oral   Take 25 mg by mouth 2 (two) times daily.         Marland Kitchen PARoxetine (PAXIL) 20 MG tablet   Oral   Take 10 mg by mouth 2 (two) times daily.         . Saxagliptin-Metformin (KOMBIGLYZE XR) 2.12-998 MG TB24   Oral   Take 1 tablet by mouth every morning.          Triage Vitals: BP 128/67  Pulse 69  Temp(Src) 98.2 F (36.8 C) (Oral)  Resp 20  SpO2 99%  Physical Exam   Nursing note and vitals reviewed. Constitutional: He is oriented to person, place, and time. He appears well-developed and well-nourished. No distress.  HENT:  Head: Normocephalic and atraumatic.  Right Ear: External ear normal.  Left Ear: External ear normal.  Mouth/Throat: Oropharynx is clear and moist. No oropharyngeal exudate.  Eyes: EOM are normal. Right eye exhibits no discharge. Left eye exhibits no discharge.  Neck: Neck supple. No tracheal deviation present.  Cardiovascular: Normal rate, regular rhythm and normal heart sounds.   No murmur heard. Pulmonary/Chest: Effort normal and breath sounds normal. No respiratory distress. He has no wheezes. He has no rales. He exhibits no tenderness.  Sounded as if he had nasal congestion.   Abdominal: Soft. There is no tenderness.  Musculoskeletal: Normal range of motion.  Neurological: He is alert and oriented to person, place, and time.  Skin: Skin is warm and dry.  Psychiatric: He has a normal mood and affect. His behavior is normal.    ED Course  Procedures (including critical care time)  DIAGNOSTIC STUDIES: Oxygen Saturation is 99% on room air, normal by my interpretation.    COORDINATION OF CARE:  2:01 PM- Discussed treatment plan with patient, which includes a nasal spray and cough syrup, and the patient agreed to the plan.   Labs Review Labs Reviewed - No data to display Imaging Review No results found.   EKG Interpretation None      MDM   Final diagnoses:  URI (upper respiratory infection)   Uri symptoms. No dyspnea, tachypnea, shortness of breath or wheezing. Discussed plan of care with pt and he agrees. Prescriptions for flucticasone and tussionex given. Encouraged use of humidifier, rest and oral hydration. Return precautions given. Follow-up with PCP.   I personally performed the services described in this documentation, which was scribed in my presence. The recorded information has been reviewed and is  accurate.    [name], NP 11/07/13 2221

## 2013-11-08 NOTE — ED Provider Notes (Signed)
Medical screening examination/treatment/procedure(s) were performed by non-physician practitioner and as supervising physician I was immediately available for consultation/collaboration.   EKG Interpretation None      Devoria AlbeIva , MD, Armando GangFACEP    Ward GivensIva L , MD 11/08/13 [phone]

## 2014-11-06 ENCOUNTER — Encounter (HOSPITAL_COMMUNITY): Payer: Self-pay | Admitting: Emergency Medicine

## 2014-11-06 ENCOUNTER — Emergency Department (HOSPITAL_COMMUNITY): Payer: Medicare Other

## 2014-11-06 ENCOUNTER — Inpatient Hospital Stay (HOSPITAL_COMMUNITY)
Admission: EM | Admit: 2014-11-06 | Discharge: 2014-11-07 | DRG: 395 | Disposition: A | Discharge status: Home / Self Care | Payer: Medicare Other | Attending: Internal Medicine | Admitting: Internal Medicine

## 2014-11-06 DIAGNOSIS — Z87891 Personal history of nicotine dependence: Secondary | ICD-10-CM

## 2014-11-06 DIAGNOSIS — Z7982 Long term (current) use of aspirin: Secondary | ICD-10-CM | POA: Diagnosis not present

## 2014-11-06 DIAGNOSIS — I10 Essential (primary) hypertension: Secondary | ICD-10-CM | POA: Diagnosis present

## 2014-11-06 DIAGNOSIS — T189XXA Foreign body of alimentary tract, part unspecified, initial encounter: Secondary | ICD-10-CM | POA: Diagnosis present

## 2014-11-06 DIAGNOSIS — Z79899 Other long term (current) drug therapy: Secondary | ICD-10-CM

## 2014-11-06 DIAGNOSIS — T17908A Unspecified foreign body in respiratory tract, part unspecified causing other injury, initial encounter: Secondary | ICD-10-CM

## 2014-11-06 DIAGNOSIS — Y998 Other external cause status: Secondary | ICD-10-CM

## 2014-11-06 DIAGNOSIS — T182XXA Foreign body in stomach, initial encounter: Principal | ICD-10-CM | POA: Diagnosis present

## 2014-11-06 DIAGNOSIS — F419 Anxiety disorder, unspecified: Secondary | ICD-10-CM | POA: Diagnosis present

## 2014-11-06 DIAGNOSIS — T17900A Unspecified foreign body in respiratory tract, part unspecified causing asphyxiation, initial encounter: Secondary | ICD-10-CM

## 2014-11-06 DIAGNOSIS — E119 Type 2 diabetes mellitus without complications: Secondary | ICD-10-CM | POA: Diagnosis present

## 2014-11-06 DIAGNOSIS — Z794 Long term (current) use of insulin: Secondary | ICD-10-CM | POA: Diagnosis not present

## 2014-11-06 DIAGNOSIS — T17308A Unspecified foreign body in larynx causing other injury, initial encounter: Secondary | ICD-10-CM

## 2014-11-06 DIAGNOSIS — T189XXD Foreign body of alimentary tract, part unspecified, subsequent encounter: Secondary | ICD-10-CM | POA: Diagnosis not present

## 2014-11-06 DIAGNOSIS — T188XXA Foreign body in other parts of alimentary tract, initial encounter: Secondary | ICD-10-CM | POA: Diagnosis not present

## 2014-11-06 LAB — BASIC METABOLIC PANEL
Anion gap: 9 (ref 5–15)
BUN: 9 mg/dL (ref 6–23)
CO2: 26 mmol/L (ref 19–32)
Calcium: 9.3 mg/dL (ref 8.4–10.5)
Chloride: 105 mmol/L (ref 96–112)
Creatinine, Ser: 1.1 mg/dL (ref 0.50–1.35)
GFR calc Af Amer: 78 mL/min — ABNORMAL LOW (ref >90)
GFR calc non Af Amer: 68 mL/min — ABNORMAL LOW (ref >90)
Glucose, Bld: 99 mg/dL (ref 70–99)
Potassium: 4 mmol/L (ref 3.5–5.1)
SODIUM: 140 mmol/L (ref 135–145)

## 2014-11-06 LAB — CBC WITH DIFFERENTIAL/PLATELET
BASOS ABS: 0 10*3/uL (ref 0.0–0.1)
BASOS PCT: 0 % (ref 0–1)
Eosinophils Absolute: 0.1 10*3/uL (ref 0.0–0.7)
Eosinophils Relative: 3 % (ref 0–5)
HEMATOCRIT: 38.4 % — AB (ref 39.0–52.0)
Hemoglobin: 12.7 g/dL — ABNORMAL LOW (ref 13.0–17.0)
LYMPHS PCT: 31 % (ref 12–46)
Lymphs Abs: 1.7 10*3/uL (ref 0.7–4.0)
MCH: 27.1 pg (ref 26.0–34.0)
MCHC: 33.1 g/dL (ref 30.0–36.0)
MCV: 81.9 fL (ref 78.0–100.0)
MONO ABS: 0.4 10*3/uL (ref 0.1–1.0)
Monocytes Relative: 7 % (ref 3–12)
NEUTROS ABS: 3.2 10*3/uL (ref 1.7–7.7)
NEUTROS PCT: 59 % (ref 43–77)
Platelets: 244 10*3/uL (ref 150–400)
RBC: 4.69 MIL/uL (ref 4.22–5.81)
RDW: 13.7 % (ref 11.5–15.5)
WBC: 5.4 10*3/uL (ref 4.0–10.5)

## 2014-11-06 LAB — URINALYSIS, ROUTINE W REFLEX MICROSCOPIC
Bilirubin Urine: NEGATIVE
GLUCOSE, UA: NEGATIVE mg/dL
Hgb urine dipstick: NEGATIVE
KETONES UR: NEGATIVE mg/dL
LEUKOCYTES UA: NEGATIVE
Nitrite: NEGATIVE
PROTEIN: NEGATIVE mg/dL
Specific Gravity, Urine: 1.01 (ref 1.005–1.030)
Urobilinogen, UA: 0.2 mg/dL (ref 0.0–1.0)
pH: 7 (ref 5.0–8.0)

## 2014-11-06 LAB — GLUCOSE, CAPILLARY: GLUCOSE-CAPILLARY: 85 mg/dL (ref 70–99)

## 2014-11-06 MED ORDER — INSULIN ASPART 100 UNIT/ML ~~LOC~~ SOLN
0.0000 [IU] | Freq: Three times a day (TID) | SUBCUTANEOUS | Status: DC
  Administered 2014-11-07: 1 [IU] via SUBCUTANEOUS

## 2014-11-06 MED ORDER — SODIUM CHLORIDE 0.9 % IJ SOLN: 3.0000 mL | Freq: Two times a day (BID) | INTRAMUSCULAR | Status: DC

## 2014-11-06 MED ORDER — SODIUM CHLORIDE 0.9 % IV SOLN
INTRAVENOUS | Status: DC
  Administered 2014-11-06 – 2014-11-07 (×2): via INTRAVENOUS

## 2014-11-06 MED ORDER — HYDROCOD POLST-CHLORPHEN POLST 10-8 MG/5ML PO LQCR: 5.0000 mL | Freq: Every evening | ORAL | Status: DC | PRN

## 2014-11-06 MED ORDER — SALINE SPRAY 0.65 % NA SOLN
1.0000 | NASAL | Status: DC | PRN
  Filled 2014-11-06: qty 44

## 2014-11-06 MED ORDER — HYDRALAZINE HCL 20 MG/ML IJ SOLN: 5.0000 mg | Freq: Four times a day (QID) | INTRAMUSCULAR | Status: DC | PRN

## 2014-11-06 MED ORDER — LORAZEPAM 2 MG/ML IJ SOLN
0.5000 mg | Freq: Four times a day (QID) | INTRAMUSCULAR | Status: DC | PRN
  Administered 2014-11-06: 0.5 mg via INTRAVENOUS
  Filled 2014-11-06: qty 1

## 2014-11-06 MED ORDER — METOPROLOL TARTRATE 1 MG/ML IV SOLN
2.5000 mg | Freq: Three times a day (TID) | INTRAVENOUS | Status: DC
  Administered 2014-11-06 – 2014-11-07 (×2): 2.5 mg via INTRAVENOUS
  Filled 2014-11-06 (×5): qty 5

## 2014-11-06 MED ORDER — FLUTICASONE PROPIONATE 50 MCG/ACT NA SUSP
2.0000 | Freq: Every day | NASAL | Status: DC
  Administered 2014-11-06 – 2014-11-07 (×2): 2 via NASAL
  Filled 2014-11-06: qty 16

## 2014-11-06 NOTE — H&P (Addendum)
Jonathan Schultz is an 69 y.o. male.    Willey Blade (pcp)  Chief Complaint: foreign body aspiration HPI: 69 yo male with dm2, hypertension, apparently had part of his denture break off and swallowed it. Measures 4cm on xray.  Pt denies fever, chills, abd pain, n/v, diarrhea, brbpr, black stool.   Pt will be admitted for denture removal.    Past Medical History  Diagnosis Date  . Acute prostatitis   . Type II or unspecified type diabetes mellitus without mention of complication, not stated as uncontrolled   . Essential hypertension, malignant   . Unspecified vitamin D deficiency   . Urethral discharge   . Anxiety     Past Surgical History  Procedure Laterality Date  . Rotator cuff repair    . Wisdom tooth extraction      Family History  Problem Relation Age of Onset  . Diabetes Mother   . Hyperlipidemia Mother    Social History:  reports that he quit smoking about 30 years ago. He does not have any smokeless tobacco history on file. He reports that he does not drink alcohol or use illicit drugs.  Allergies:  Allergies  Allergen Reactions  . Penicillins Other (See Comments)    "Faints out"  Medications reviewed   (Not in a hospital admission)  No results found for this or any previous visit (from the past 48 hour(s)). Dg Abd 1 View  11/06/2014   CLINICAL DATA:  Accidentally swallowed piece of broken denture while eating hot dog. Initial encounter.  EXAM: ABDOMEN - 1 VIEW  COMPARISON:  None.  FINDINGS: An apparent 4.1 cm foreign body is noted overlying the upper abdomen, likely at the antrum of the stomach. This contains metal and additional high-density material, and likely corresponds to the patient's denture fragment.  The visualized bowel gas pattern is unremarkable. Scattered air and stool filled loops of colon are seen; no abnormal dilatation of small bowel loops is seen to suggest small bowel obstruction. No free intra-abdominal air is identified, though evaluation for free  air is limited on a single supine view.  The visualized osseous structures are within normal limits; the sacroiliac joints are unremarkable in appearance. The visualized lung bases are essentially clear.  IMPRESSION: 1. Apparent 4.1 cm denture fragment noted overlying the antrum of the stomach. 2. Unremarkable bowel gas pattern; no free intra-abdominal air seen. Small to moderate amount of stool noted in the colon.   Electronically Signed   By: Roanna Raider M.D.   On: 11/06/2014 17:30    Review of Systems  Constitutional: Negative.   HENT: Negative.   Eyes: Negative.   Respiratory: Negative.   Cardiovascular: Negative.   Gastrointestinal: Negative for heartburn, nausea, vomiting, abdominal pain, diarrhea, constipation, blood in stool and melena.  Genitourinary: Negative.   Musculoskeletal: Negative.   Skin: Negative.   Neurological: Negative.   Endo/Heme/Allergies: Positive for environmental allergies. Negative for polydipsia. Does not bruise/bleed easily.  Psychiatric/Behavioral: Negative.     Blood pressure 132/66, pulse 65, temperature 97.8 F (36.6 C), temperature source Oral, resp. rate 16, height  (1.753 m), weight 82.555 kg (182 lb), SpO2 100 %. Physical Exam  Constitutional: He is oriented to person, place, and time. He appears well-developed and well-nourished.  HENT:  Head: Normocephalic and atraumatic.  Eyes: Conjunctivae and EOM are normal. Pupils are equal, round, and reactive to light. No scleral icterus.  Neck: Normal range of motion. Neck supple. No JVD present. No tracheal deviation present. No  thyromegaly present.  Cardiovascular: Normal rate and regular rhythm.  Exam reveals no gallop and no friction rub.   No murmur heard. Respiratory: Effort normal and breath sounds normal.  GI: Soft. Bowel sounds are normal. He exhibits no distension. There is no tenderness. There is no rebound and no guarding.  Musculoskeletal: Normal range of motion. He exhibits no edema  or tenderness.  Lymphadenopathy:    He has no cervical adenopathy.  Neurological: He is alert and oriented to person, place, and time. He has normal reflexes. He displays normal reflexes. No cranial nerve deficit. He exhibits normal muscle tone. Coordination normal.  Skin: Skin is warm and dry.  Psychiatric: He has a normal mood and affect. His behavior is normal. Judgment and thought content normal.     Assessment/Plan Foreign body aspiration (denture) NPO Ns iv GI consult pending, appreciate input from Dr. Christella HartiganJacobs Surgery consult pending, appreciate input from Dr. Alexander MtByerly Cbc, cmp in am Abdominal xray in am  Dm2 fsbs q4h, iss  Hypertension Stop po medication We will use lopressor 2.5mg  iv q8h,  Hold sbp<100,. Hr <60 And use hydralazine 5mg  iv q6h prn sbp >160  DVT prophylaxis: scd   Pearson GrippeKIM,  11/06/2014, 8:45 PM

## 2014-11-06 NOTE — ED Provider Notes (Signed)
CSN: [id number]     Arrival date & time 11/06/14  1604 History   First MD Initiated Contact with Patient 11/06/14 1918     Chief Complaint  Patient presents with  . Swallowed Foreign Body     (Consider location/radiation/quality/duration/timing/severity/associated sxs/prior Treatment) HPI Comments: Jonathan Schultz is a 69 y.o. male with a PMHx of DM2, HTN, HLD, vitamin D deficiency, anxiety, and remote hx of prostatitis, who presents to the ED with complaints of swallowing a portion of his denture while he was eating a hotdog around 3:30 PM. He states that the interfragment initially got stuck in his throat, and he drank copious amounts of Coke which allowed the fragment to go down and pass through his throat without any pain. He reports that on his way home, he became nervous that the fragment might "cut his insides" since it was very sharp, therefore he came to the emergency room. He denies any fevers, chills, drooling, difficulty swallowing, sore throat, ongoing choking, shortness of breath, wheezing, stridor, chest pain, nausea, vomiting, abdominal pain, dysuria, hematuria, or neck/back pain. Had a colonoscopy many years ago, done in [facility], can't recall when or who performed it.  Patient is a 69 y.o. male presenting with foreign body swallowed. The history is provided by the patient. No language interpreter was used.  Swallowed Foreign Body This is a new problem. The current episode started today. The problem occurs rarely. The problem has been unchanged. Pertinent negatives include no abdominal pain, chest pain, chills, fever, nausea, neck pain, numbness, vomiting or weakness. Nothing aggravates the symptoms. He has tried drinking for the symptoms. The treatment provided moderate relief.    Past Medical History  Diagnosis Date  . Acute prostatitis   . Type II or unspecified type diabetes mellitus without mention of complication, not stated as uncontrolled   . Essential hypertension,  malignant   . Pure hypercholesterolemia   . Unspecified vitamin D deficiency   . Urethral discharge   . Anxiety    Past Surgical History  Procedure Laterality Date  . Rotator cuff repair    . Wisdom tooth extraction     Family History  Problem Relation Age of Onset  . Diabetes Mother   . Hyperlipidemia Mother    History  Substance Use Topics  . Smoking status: Never Smoker   . Smokeless tobacco: Not on file  . Alcohol Use: No    Review of Systems  Constitutional: Negative for fever and chills.  HENT: Positive for dental problem (denture broke, swallowed large portion). Negative for drooling and trouble swallowing.   Respiratory: Negative for choking, shortness of breath, wheezing and stridor.   Cardiovascular: Negative for chest pain.  Gastrointestinal: Negative for nausea, vomiting and abdominal pain.  Genitourinary: Negative for dysuria and hematuria.  Musculoskeletal: Negative for back pain and neck pain.  Allergic/Immunologic: Positive for immunocompromised state (diabetic).  Neurological: Negative for weakness and numbness.  Hematological: Does not bruise/bleed easily.   10 Systems reviewed and are negative for acute change except as noted in the HPI.    Allergies  Penicillins  Home Medications   Prior to Admission medications   Medication Sig Start Date End Date Taking? Authorizing Provider  acetaminophen (TYLENOL) 500 MG tablet Take 1,000 mg by mouth every 8 (eight) hours as needed for mild pain.   Yes Historical Provider, MD  ALPRAZolam Prudy Feeler) 0.5 MG tablet Take 0.5 mg by mouth 3 (three) times daily.     Yes Historical Provider, MD  amLODipine (  NORVASC) 5 MG tablet Take 5 mg by mouth every morning.    Yes Historical Provider, MD  aspirin 81 MG tablet Take 81 mg by mouth every morning.    Yes Historical Provider, MD  hydrOXYzine (ATARAX/VISTARIL) 10 MG tablet Take 10 mg by mouth at bedtime as needed for itching.   Yes Historical Provider, MD  insulin detemir  (LEVEMIR) 100 UNIT/ML injection Inject 10 Units into the skin at bedtime.    Yes Historical Provider, MD  metoprolol (LOPRESSOR) 50 MG tablet Take 25 mg by mouth 2 (two) times daily.   Yes Historical Provider, MD  PARoxetine (PAXIL) 20 MG tablet Take 10 mg by mouth 2 (two) times daily.   Yes Historical Provider, MD  Saxagliptin-Metformin (KOMBIGLYZE XR) 2.12-998 MG TB24 Take 1 tablet by mouth every morning.   Yes Historical Provider, MD  chlorpheniramine-HYDROcodone (TUSSIONEX PENNKINETIC ER) 10-8 MG/5ML LQCR Take 5 mLs by mouth at bedtime as needed for cough. Patient not taking: Reported on 11/06/2014 10/18/13   Irish EldersKelly Walker, NP  fluticasone [facility](FLONASE) 50 MCG/ACT nasal spray Place 2 sprays into both nostrils daily. Patient not taking: Reported on 11/06/2014 10/18/13   Irish EldersKelly Walker, NP   BP 132/66 mmHg  Pulse 65  Temp(Src) 97.8 F (36.6 C) (Oral)  Resp 16  Ht 5\' 9"  (1.753 m)  Wt 182 lb (82.555 kg)  BMI 26.86 kg/m2  SpO2 100% Physical Exam  Constitutional: He is oriented to person, place, and time. Vital signs are normal. He appears well-developed and well-nourished.  Non-toxic appearance. No distress.  Afebrile, nontoxic, NAD  HENT:  Head: Normocephalic and atraumatic.  Mouth/Throat: Oropharynx is clear and moist and mucous membranes are normal. He has dentures.  Oropharynx clear and moist without abrasions or erythema, patent airway Denture that remains has linear cut at approximately midline, with R side of the denture missing, and moderately sharp edges particularly at the ends of the fracture line  Eyes: Conjunctivae and EOM are normal. Right eye exhibits no discharge. Left eye exhibits no discharge.  Neck: Trachea normal and normal range of motion. Neck supple.  No trachea or anterior neck TTP, no stridor  Cardiovascular: Normal rate, regular rhythm, normal heart sounds and intact distal pulses.  Exam reveals no gallop and no friction rub.   No murmur heard. Pulmonary/Chest: Effort  normal and breath sounds normal. No respiratory distress. He has no decreased breath sounds. He has no wheezes. He has no rhonchi. He has no rales.  Abdominal: Soft. Normal appearance and bowel sounds are normal. He exhibits no distension. There is no tenderness. There is no rigidity, no rebound, no guarding, no tenderness at McBurney's point and negative Murphy's sign.  Soft, NTND, +BS throughout, no r/g/r, neg murphy's, neg mcburney's  Musculoskeletal: Normal range of motion.  Neurological: He is alert and oriented to person, place, and time. He has normal strength. No sensory deficit.  Skin: Skin is warm, dry and intact. No rash noted.  Psychiatric: He has a normal mood and affect.  Nursing note and vitals reviewed.   ED Course  Procedures (including critical care time) Labs Review Labs Reviewed  URINALYSIS, ROUTINE W REFLEX MICROSCOPIC  BASIC METABOLIC PANEL  CBC WITH DIFFERENTIAL/PLATELET    Imaging Review Dg Abd 1 View  11/06/2014   CLINICAL DATA:  Accidentally swallowed piece of broken denture while eating hot dog. Initial encounter.  EXAM: ABDOMEN - 1 VIEW  COMPARISON:  None.  FINDINGS: An apparent 4.1 cm foreign body is noted overlying the upper abdomen,  likely at the antrum of the stomach. This contains metal and additional high-density material, and likely corresponds to the patient's denture fragment.  The visualized bowel gas pattern is unremarkable. Scattered air and stool filled loops of colon are seen; no abnormal dilatation of small bowel loops is seen to suggest small bowel obstruction. No free intra-abdominal air is identified, though evaluation for free air is limited on a single supine view.  The visualized osseous structures are within normal limits; the sacroiliac joints are unremarkable in appearance. The visualized lung bases are essentially clear.  IMPRESSION: 1. Apparent 4.1 cm denture fragment noted overlying the antrum of the stomach. 2. Unremarkable bowel gas  pattern; no free intra-abdominal air seen. Small to moderate amount of stool noted in the colon.   Electronically Signed   By: Roanna Raider M.D.   On: 11/06/2014 17:30     EKG Interpretation None      MDM   Final diagnoses:  Foreign body, swallowed, initial encounter    69 y.o. male here after accidentally swallowing portion of denture while eating a hotdog. Remainder of denture examined, and it shows straight edge with very sharp points. Abd Xray reveals 4.1cm FB in antrum of stomach without obstructive or perforation changes. No abd tenderness, oropharynx clear, patent airway. Given that the FB is sharp, will consult GI to discuss case.   7:41 PM Dr. Christella Hartigan returning page, is hesitant to remove via Endoscopy due to size of fragment. Would like me to call surgery first.  8:04 PM Dr. Christella Hartigan calling back, states that he feels that endoscopic management is indicated within first 24hrs, but would like it to be under a more controlled, scheduled setting in the OR tomorrow morning. Requested that he be admitted to medicine and he will schedule OR for tomorrow AM. Dr. Donell Beers of surgery returning page, states that she feels this is an excellent plan. [facility] consulted for admission. Updated pt.   8:43 PM Dr. Selena Batten returning page, will admit pt. Basic labs ordered, not yet resulted. Please see Dr. Elmyra Ricks note for ongoing documentation of care  BP 132/66 mmHg  Pulse 65  Temp(Src) 97.8 F (36.6 C) (Oral)  Resp 16  Ht  (1.753 m)  Wt 182 lb (82.555 kg)  BMI 26.86 kg/m2  SpO2 100%    Camprubi-Soms, PA-C 11/06/14 2044  Arby Barrette, MD 11/06/14 2142

## 2014-11-06 NOTE — Consult Note (Signed)
Reason for Consult:swallowed foreign body Referring Physician: Charlesetta Shanks, MD  Jonathan Schultz is an 69 y.o. male.  HPI:  Patient is a 69 year old male who swallowed a portion of his dentures earlier tonight. He states that he felt it lodge in the back of his throat while he was eating a hot dog. He was unable to cough it up and felt like he was not able to breathe. He drank a bunch more Coca-Cola and was able to swallow the portion of the dentures down. He denies any abdominal pain, nausea, vomiting, shortness of breath, chest pain. He has not had fevers and chills. This happened around 3:30 pm. He denies wheezing/stridor/drooling.  He became upset when he thought about the fragments and decided to seek care.    Past Medical History  Diagnosis Date  . Acute prostatitis   . Type II or unspecified type diabetes mellitus without mention of complication, not stated as uncontrolled   . Essential hypertension, malignant   . Unspecified vitamin D deficiency   . Urethral discharge   . Anxiety     Past Surgical History  Procedure Laterality Date  . Rotator cuff repair    . Wisdom tooth extraction      Family History  Problem Relation Age of Onset  . Diabetes Mother   . Hyperlipidemia Mother     Social History:  reports that he quit smoking about 30 years ago. He does not have any smokeless tobacco history on file. He reports that he does not drink alcohol or use illicit drugs.  Allergies:  Allergies  Allergen Reactions  . Penicillins Other (See Comments)    "Faints out"    Medications:  Prior to Admission:  Prescriptions prior to admission  Medication Sig Dispense Refill Last Dose  . acetaminophen (TYLENOL) 500 MG tablet Take 1,000 mg by mouth every 8 (eight) hours as needed for mild pain.   Past Month at Unknown time  . ALPRAZolam (XANAX) 0.5 MG tablet Take 0.5 mg by mouth 3 (three) times daily.     11/06/2014 at Unknown time  . amLODipine (NORVASC) 5 MG tablet Take 5 mg by  mouth every morning.    11/06/2014 at Unknown time  . aspirin 81 MG tablet Take 81 mg by mouth every morning.    Past Month at Unknown time  . hydrOXYzine (ATARAX/VISTARIL) 10 MG tablet Take 10 mg by mouth at bedtime as needed for itching.   11/05/2014 at Unknown time  . insulin detemir (LEVEMIR) 100 UNIT/ML injection Inject 10 Units into the skin at bedtime.    Past Week at Unknown time  . metoprolol (LOPRESSOR) 50 MG tablet Take 25 mg by mouth 2 (two) times daily.   11/06/2014 at 0530  . PARoxetine (PAXIL) 20 MG tablet Take 10 mg by mouth 2 (two) times daily.   11/06/2014 at Unknown time  . Saxagliptin-Metformin (KOMBIGLYZE XR) 2.12-998 MG TB24 Take 1 tablet by mouth every morning.   11/06/2014 at Unknown time  . chlorpheniramine-HYDROcodone (TUSSIONEX PENNKINETIC ER) 10-8 MG/5ML LQCR Take 5 mLs by mouth at bedtime as needed for cough. (Patient not taking: Reported on 11/06/2014) 115 mL 0 Not Taking at Unknown time  . fluticasone (FLONASE) 50 MCG/ACT nasal spray Place 2 sprays into both nostrils daily. (Patient not taking: Reported on 11/06/2014) 16 g 2 Not Taking at Unknown time    Results for orders placed or performed during the hospital encounter of 11/06/14 (from the past 48 hour(s))  Basic metabolic panel  Status: Abnormal   Collection Time: 11/06/14  8:22 PM  Result Value Ref Range   Sodium 140 135 - 145 mmol/L   Potassium 4.0 3.5 - 5.1 mmol/L   Chloride 105 96 - 112 mmol/L   CO2 26 19 - 32 mmol/L   Glucose, Bld 99 70 - 99 mg/dL   BUN 9 6 - 23 mg/dL   Creatinine, Ser 1.10 0.50 - 1.35 mg/dL   Calcium 9.3 8.4 - 10.5 mg/dL   GFR calc non Af Amer 68 (L) >90 mL/min   GFR calc Af Amer 78 (L) >90 mL/min    Comment: (NOTE) The eGFR has been calculated using the CKD EPI equation. This calculation has not been validated in all clinical situations. eGFR's persistently <90 mL/min signify possible Chronic Kidney Disease.    Anion gap 9 5 - 15  CBC with Differential     Status: Abnormal    Collection Time: 11/06/14  8:22 PM  Result Value Ref Range   WBC 5.4 4.0 - 10.5 K/uL   RBC 4.69 4.22 - 5.81 MIL/uL   Hemoglobin 12.7 (L) 13.0 - 17.0 g/dL   HCT 38.4 (L) 39.0 - 52.0 %   MCV 81.9 78.0 - 100.0 fL   MCH 27.1 26.0 - 34.0 pg   MCHC 33.1 30.0 - 36.0 g/dL   RDW 13.7 11.5 - 15.5 %   Platelets 244 150 - 400 K/uL   Neutrophils Relative % 59 43 - 77 %   Neutro Abs 3.2 1.7 - 7.7 K/uL   Lymphocytes Relative 31 12 - 46 %   Lymphs Abs 1.7 0.7 - 4.0 K/uL   Monocytes Relative 7 3 - 12 %   Monocytes Absolute 0.4 0.1 - 1.0 K/uL   Eosinophils Relative 3 0 - 5 %   Eosinophils Absolute 0.1 0.0 - 0.7 K/uL   Basophils Relative 0 0 - 1 %   Basophils Absolute 0.0 0.0 - 0.1 K/uL  Urinalysis, Routine w reflex microscopic     Status: Abnormal   Collection Time: 11/06/14  9:00 PM  Result Value Ref Range   Color, Urine YELLOW YELLOW   APPearance CLOUDY (A) CLEAR   Specific Gravity, Urine 1.010 1.005 - 1.030   pH 7.0 5.0 - 8.0   Glucose, UA NEGATIVE NEGATIVE mg/dL   Hgb urine dipstick NEGATIVE NEGATIVE   Bilirubin Urine NEGATIVE NEGATIVE   Ketones, ur NEGATIVE NEGATIVE mg/dL   Protein, ur NEGATIVE NEGATIVE mg/dL   Urobilinogen, UA 0.2 0.0 - 1.0 mg/dL   Nitrite NEGATIVE NEGATIVE   Leukocytes, UA NEGATIVE NEGATIVE    Comment: MICROSCOPIC NOT DONE ON URINES WITH NEGATIVE PROTEIN, BLOOD, LEUKOCYTES, NITRITE, OR GLUCOSE <1000 mg/dL.    Dg Abd 1 View  11/06/2014   CLINICAL DATA:  Accidentally swallowed piece of broken denture while eating hot dog. Initial encounter.  EXAM: ABDOMEN - 1 VIEW  COMPARISON:  None.  FINDINGS: An apparent 4.1 cm foreign body is noted overlying the upper abdomen, likely at the antrum of the stomach. This contains metal and additional high-density material, and likely corresponds to the patient's denture fragment.  The visualized bowel gas pattern is unremarkable. Scattered air and stool filled loops of colon are seen; no abnormal dilatation of small bowel loops is seen to  suggest small bowel obstruction. No free intra-abdominal air is identified, though evaluation for free air is limited on a single supine view.  The visualized osseous structures are within normal limits; the sacroiliac joints are unremarkable in appearance. The  visualized lung bases are essentially clear.  IMPRESSION: 1. Apparent 4.1 cm denture fragment noted overlying the antrum of the stomach. 2. Unremarkable bowel gas pattern; no free intra-abdominal air seen. Small to moderate amount of stool noted in the colon.   Electronically Signed   By: Garald Balding M.D.   On: 11/06/2014 17:30    Review of Systems  All other systems reviewed and are negative.  Blood pressure 145/68, pulse 68, temperature 97.8 F (36.6 C), temperature source Oral, resp. rate 14, height 5' 9" (1.753 m), weight 82.555 kg (182 lb), SpO2 100 %. Physical Exam  Constitutional: He is oriented to person, place, and time. He appears well-developed and well-nourished. No distress.  HENT:  Head: Normocephalic and atraumatic.  Eyes: Conjunctivae are normal. Pupils are equal, round, and reactive to light. No scleral icterus.  Neck: Normal range of motion. Neck supple.  Cardiovascular: Normal rate.   Respiratory: Effort normal. No respiratory distress.  GI: He exhibits no distension. There is no tenderness. There is no rebound and no guarding.  Neurological: He is alert and oriented to person, place, and time.  Skin: Skin is warm and dry. No rash noted. He is not diaphoretic. No erythema. No pallor.  Psychiatric: He has a normal mood and affect. His behavior is normal. Judgment and thought content normal.    Assessment/Plan: Foreign body in alimentary canal.   Dr. Ardis Hughs was contacted by the ED.  They will plan to attempt endoscopic retrieval in AM tomorrow.  We were asked to evaluate patient in case it could not be retrieved.    I will plan repeat abdominal plain film in AM to assess whether the fragment is still in the  stomach.    , 11/06/2014, 10:22 PM

## 2014-11-06 NOTE — ED Notes (Signed)
Pt sts he was eating a hotdog and his denture broke and he swallow it.

## 2014-11-06 NOTE — Consult Note (Signed)
[facility] Referring Provider: Dr. Kim Primary Care Physician:  DEAN, ERIC, MD Primary Gastroenterologist:  None    Reason for Consultation: Foreign body in stomach   HPI:  Jonathan Schultz is a 68 y.o. male with foreign body in his stomach.    HE was eating a hot dog tonight, his upper denture cracked in his mouth and he swallowed about 1/3 to 1/2 of it.  It was very traumatic, felt like he was choking for a short time, drank a lot of coke to wash it into his stomach.  Has had no chest pain, no fevers. CAme to ER because he was concerned that the piece was Schultz and could cause problems.  He had plain films, see below.   Past Medical History  Diagnosis Date  . Acute prostatitis   . Type II or unspecified type diabetes mellitus without mention of complication, not stated as uncontrolled   . Essential hypertension, malignant   . Unspecified vitamin D deficiency   . Urethral discharge   . Anxiety     Past Surgical History  Procedure Laterality Date  . Rotator cuff repair    . Wisdom tooth extraction      Prior to Admission medications   Medication Sig Start Date End Date Taking? Authorizing Provider  acetaminophen (TYLENOL) 500 MG tablet Take 1,000 mg by mouth every 8 (eight) hours as needed for mild pain.   Yes Historical Provider, MD  ALPRAZolam (XANAX) 0.5 MG tablet Take 0.5 mg by mouth 3 (three) times daily.     Yes Historical Provider, MD  amLODipine (NORVASC) 5 MG tablet Take 5 mg by mouth every morning.    Yes Historical Provider, MD  aspirin 81 MG tablet Take 81 mg by mouth every morning.    Yes Historical Provider, MD  hydrOXYzine (ATARAX/VISTARIL) 10 MG tablet Take 10 mg by mouth at bedtime as needed for itching.   Yes Historical Provider, MD  insulin detemir (LEVEMIR) 100 UNIT/ML injection Inject 10 Units into the skin at bedtime.    Yes Historical Provider, MD  metoprolol (LOPRESSOR) 50 MG tablet Take 25 mg by mouth 2 (two) times daily.   Yes Historical  Provider, MD  PARoxetine (PAXIL) 20 MG tablet Take 10 mg by mouth 2 (two) times daily.   Yes Historical Provider, MD  Saxagliptin-Metformin (KOMBIGLYZE XR) 2.12-998 MG TB24 Take 1 tablet by mouth every morning.   Yes Historical Provider, MD  chlorpheniramine-HYDROcodone (TUSSIONEX PENNKINETIC ER) 10-8 MG/5ML LQCR Take 5 mLs by mouth at bedtime as needed for cough. Patient not taking: Reported on 11/06/2014 10/18/13   Kelly Walker, NP  fluticasone (FLONASE) 50 MCG/ACT nasal spray Place 2 sprays into both nostrils daily. Patient not taking: Reported on 11/06/2014 10/18/13   Kelly Walker, NP    No current facility-administered medications for this encounter.   Current Outpatient Prescriptions  Medication Sig Dispense Refill  . acetaminophen (TYLENOL) 500 MG tablet Take 1,000 mg by mouth every 8 (eight) hours as needed for mild pain.    . ALPRAZolam (XANAX) 0.5 MG tablet Take 0.5 mg by mouth 3 (three) times daily.      . amLODipine (NORVASC) 5 MG tablet Take 5 mg by mouth every morning.     . aspirin 81 MG tablet Take 81 mg by mouth every morning.     . hydrOXYzine (ATARAX/VISTARIL) 10 MG tablet Take 10 mg by mouth at bedtime as needed for itching.    . insulin detemir (LEVEMIR) 100 UNIT/ML injection Inject   10 Units into the skin at bedtime.     . metoprolol (LOPRESSOR) 50 MG tablet Take 25 mg by mouth 2 (two) times daily.    . PARoxetine (PAXIL) 20 MG tablet Take 10 mg by mouth 2 (two) times daily.    . Saxagliptin-Metformin (KOMBIGLYZE XR) 2.12-998 MG TB24 Take 1 tablet by mouth every morning.    . chlorpheniramine-HYDROcodone (TUSSIONEX PENNKINETIC ER) 10-8 MG/5ML LQCR Take 5 mLs by mouth at bedtime as needed for cough. (Patient not taking: Reported on 11/06/2014) 115 mL 0  . fluticasone (FLONASE) 50 MCG/ACT nasal spray Place 2 sprays into both nostrils daily. (Patient not taking: Reported on 11/06/2014) 16 g 2    Allergies as of 11/06/2014 - Review Complete 11/06/2014  Allergen Reaction Noted  .  Penicillins Other (See Comments) 11/27/2010    Family History  Problem Relation Age of Onset  . Diabetes Mother   . Hyperlipidemia Mother     History   Social History  . Marital Status: Divorced    Spouse Name: N/A  . Number of Children: N/A  . Years of Education: N/A   Occupational History  . Not on file.   Social History Main Topics  . Smoking status: Former Smoker    Quit date: 06/03/1984  . Smokeless tobacco: Not on file  . Alcohol Use: No  . Drug Use: No  . Sexual Activity: Not on file   Other Topics Concern  . Not on file   Social History Narrative     Review of Systems: Pertinent positive and negative review of systems were noted in the above HPI section. Complete review of systems was performed and was otherwise normal.   Physical Exam: Vital signs in last 24 hours: Temp:  [97.8 F (36.6 C)] 97.8 F (36.6 C) (03/23 1612) Pulse Rate:  [63-65] 63 (03/23 2111) Resp:  [14-16] 14 (03/23 2111) BP: (132-155)/(66-74) 155/74 mmHg (03/23 2111) SpO2:  [97 %-100 %] 97 % (03/23 2111) Weight:  [182 lb (82.555 kg)] 182 lb (82.555 kg) (03/23 1612)   Constitutional: generally well-appearing Psychiatric: alert and oriented x3 Eyes: extraocular movements intact Mouth: oral pharynx moist, no lesions Neck: supple no lymphadenopathy Cardiovascular: heart regular rate and rhythm Lungs: clear to auscultation bilaterally Abdomen: soft, nontender, nondistended, no obvious ascites, no peritoneal signs, normal bowel sounds Extremities: no lower extremity edema bilaterally Skin: no lesions on visible extremities   Imaging/Other Results: Dg Abd 1 View  11/06/2014   CLINICAL DATA:  Accidentally swallowed piece of broken denture while eating hot dog. Initial encounter.  EXAM: ABDOMEN - 1 VIEW  COMPARISON:  None.  FINDINGS: An apparent 4.1 cm foreign body is noted overlying the upper abdomen, likely at the antrum of the stomach. This contains metal and additional high-density  material, and likely corresponds to the patient's denture fragment.  The visualized bowel gas pattern is unremarkable. Scattered air and stool filled loops of colon are seen; no abnormal dilatation of small bowel loops is seen to suggest small bowel obstruction. No free intra-abdominal air is identified, though evaluation for free air is limited on a single supine view.  The visualized osseous structures are within normal limits; the sacroiliac joints are unremarkable in appearance. The visualized lung bases are essentially clear.  IMPRESSION: 1. Apparent 4.1 cm denture fragment noted overlying the antrum of the stomach. 2. Unremarkable bowel gas pattern; no free intra-abdominal air seen. Small to moderate amount of stool noted in the colon.   Electronically Signed   By: Jeffery    Chang M.D.   On: 11/06/2014 17:30      Impression/Plan: 68 y.o. male with accidental foreign body swallowed  Schultz object, medium sized (1/3 to 1/2 upper denture).  He understands that there are risks in attempting to remove this endoscopically, most major is esophageal perforation.  He understands that there is a risk in letting this attempt to pass from his stomach then more distally. Literature search suggests "up to 35%" perforation risk of SB or colon.  He is being admitted overnight, will attempt endoscopic removal tomorrow with him intubated.  Will allow time to ensure proper devices are available, understoon (overtube, hood, etc).  Will keep him NPO after MN.   ,  P, MD  11/06/2014, 9:18 PM [facility] Pager [phone]    

## 2014-11-07 ENCOUNTER — Inpatient Hospital Stay (HOSPITAL_COMMUNITY): Payer: Medicare Other

## 2014-11-07 ENCOUNTER — Inpatient Hospital Stay (HOSPITAL_COMMUNITY): Payer: Medicare Other | Admitting: Certified Registered Nurse Anesthetist

## 2014-11-07 ENCOUNTER — Telehealth: Payer: Self-pay

## 2014-11-07 ENCOUNTER — Encounter (HOSPITAL_COMMUNITY): Payer: Self-pay | Admitting: Certified Registered Nurse Anesthetist

## 2014-11-07 ENCOUNTER — Encounter (HOSPITAL_COMMUNITY)
Admission: EM | Disposition: A | Discharge status: Home / Self Care | Payer: Self-pay | Source: Home / Self Care | Attending: Internal Medicine

## 2014-11-07 DIAGNOSIS — T189XXD Foreign body of alimentary tract, part unspecified, subsequent encounter: Secondary | ICD-10-CM

## 2014-11-07 DIAGNOSIS — T188XXA Foreign body in other parts of alimentary tract, initial encounter: Secondary | ICD-10-CM

## 2014-11-07 DIAGNOSIS — T189XXA Foreign body of alimentary tract, part unspecified, initial encounter: Secondary | ICD-10-CM | POA: Insufficient documentation

## 2014-11-07 HISTORY — PX: ESOPHAGOGASTRODUODENOSCOPY (EGD) WITH PROPOFOL: SHX5813

## 2014-11-07 LAB — CBC
HCT: 36.6 % — ABNORMAL LOW (ref 39.0–52.0)
Hemoglobin: 12.1 g/dL — ABNORMAL LOW (ref 13.0–17.0)
MCH: 26.9 pg (ref 26.0–34.0)
MCHC: 33.1 g/dL (ref 30.0–36.0)
MCV: 81.5 fL (ref 78.0–100.0)
PLATELETS: 230 10*3/uL (ref 150–400)
RBC: 4.49 MIL/uL (ref 4.22–5.81)
RDW: 13.6 % (ref 11.5–15.5)
WBC: 5.7 10*3/uL (ref 4.0–10.5)

## 2014-11-07 LAB — COMPREHENSIVE METABOLIC PANEL
ALK PHOS: 44 U/L (ref 39–117)
ALT: 16 U/L (ref 0–53)
AST: 22 U/L (ref 0–37)
Albumin: 3.6 g/dL (ref 3.5–5.2)
Anion gap: 6 (ref 5–15)
BILIRUBIN TOTAL: 0.5 mg/dL (ref 0.3–1.2)
BUN: 9 mg/dL (ref 6–23)
CO2: 27 mmol/L (ref 19–32)
Calcium: 9.1 mg/dL (ref 8.4–10.5)
Chloride: 107 mmol/L (ref 96–112)
Creatinine, Ser: 1.01 mg/dL (ref 0.50–1.35)
GFR calc non Af Amer: 75 mL/min — ABNORMAL LOW (ref >90)
GFR, EST AFRICAN AMERICAN: 87 mL/min — AB (ref >90)
Glucose, Bld: 114 mg/dL — ABNORMAL HIGH (ref 70–99)
POTASSIUM: 3.9 mmol/L (ref 3.5–5.1)
Sodium: 140 mmol/L (ref 135–145)
Total Protein: 6.4 g/dL (ref 6.0–8.3)

## 2014-11-07 LAB — APTT: aPTT: 34 seconds (ref 24–37)

## 2014-11-07 LAB — GLUCOSE, CAPILLARY: Glucose-Capillary: 122 mg/dL — ABNORMAL HIGH (ref 70–99)

## 2014-11-07 LAB — PROTIME-INR
INR: 1.07 (ref 0.00–1.49)
PROTHROMBIN TIME: 14.1 s (ref 11.6–15.2)

## 2014-11-07 SURGERY — ESOPHAGOGASTRODUODENOSCOPY (EGD) WITH PROPOFOL
Anesthesia: Monitor Anesthesia Care

## 2014-11-07 MED ORDER — UNABLE TO FIND: Status: AC

## 2014-11-07 MED ORDER — NALOXONE NEWBORN-WH INJECTION 0.4 MG/ML
INTRAMUSCULAR | Status: DC | PRN
  Administered 2014-11-07: .04 mg via INTRAMUSCULAR

## 2014-11-07 MED ORDER — SODIUM CHLORIDE 0.9 % IV SOLN: INTRAVENOUS | Status: DC

## 2014-11-07 MED ORDER — LIDOCAINE HCL (CARDIAC) 20 MG/ML IV SOLN
INTRAVENOUS | Status: DC | PRN
  Administered 2014-11-07: 50 mg via INTRAVENOUS

## 2014-11-07 MED ORDER — LACTATED RINGERS IV SOLN
INTRAVENOUS | Status: DC | PRN
  Administered 2014-11-07: 11:00:00 via INTRAVENOUS

## 2014-11-07 MED ORDER — BUTAMBEN-TETRACAINE-BENZOCAINE 2-2-14 % EX AERO
INHALATION_SPRAY | CUTANEOUS | Status: DC | PRN
  Administered 2014-11-07: 2 via TOPICAL

## 2014-11-07 MED ORDER — FENTANYL CITRATE 0.05 MG/ML IJ SOLN
INTRAMUSCULAR | Status: DC | PRN
  Administered 2014-11-07: 100 ug via INTRAVENOUS
  Administered 2014-11-07 (×2): 50 ug via INTRAVENOUS

## 2014-11-07 MED ORDER — FENTANYL CITRATE 0.05 MG/ML IJ SOLN
INTRAMUSCULAR | Status: AC
  Filled 2014-11-07: qty 5

## 2014-11-07 MED ORDER — PROPOFOL 10 MG/ML IV BOLUS
INTRAVENOUS | Status: DC | PRN
  Administered 2014-11-07 (×2): 30 mg via INTRAVENOUS

## 2014-11-07 MED ORDER — LACTATED RINGERS IV SOLN
INTRAVENOUS | Status: DC
  Administered 2014-11-07: 1000 mL via INTRAVENOUS

## 2014-11-07 MED ORDER — MIDAZOLAM HCL 2 MG/2ML IJ SOLN
INTRAMUSCULAR | Status: AC
  Filled 2014-11-07: qty 2

## 2014-11-07 MED ORDER — PROPOFOL INFUSION 10 MG/ML OPTIME
INTRAVENOUS | Status: DC | PRN
  Administered 2014-11-07: 50 ug/kg/min via INTRAVENOUS
  Administered 2014-11-07: 35 ug/kg/min via INTRAVENOUS

## 2014-11-07 NOTE — Telephone Encounter (Signed)
-----   Message from Rachael Feeaniel P Jacobs, MD sent at 11/07/2014 11:11 AM EDT ----- He needs upright KUB in 7 days to check for persistent dentures in GI tract.  Thanks

## 2014-11-07 NOTE — H&P (View-Only) (Signed)
[facility] Referring Provider: Dr. Selena Batten Primary Care Physician:  Willey Blade, MD Primary Gastroenterologist:  None    Reason for Consultation: Foreign body in stomach   HPI:  Jonathan Schultz is a 69 y.o. male with foreign body in his stomach.    HE was eating a hot dog tonight, his upper denture cracked in his mouth and he swallowed about 1/3 to 1/2 of it.  It was very traumatic, felt like he was choking for a short time, drank a lot of coke to wash it into his stomach.  Has had no chest pain, no fevers. CAme to ER because he was concerned that the piece was sharp and could cause problems.  He had plain films, see below.   Past Medical History  Diagnosis Date  . Acute prostatitis   . Type II or unspecified type diabetes mellitus without mention of complication, not stated as uncontrolled   . Essential hypertension, malignant   . Unspecified vitamin D deficiency   . Urethral discharge   . Anxiety     Past Surgical History  Procedure Laterality Date  . Rotator cuff repair    . Wisdom tooth extraction      Prior to Admission medications   Medication Sig Start Date End Date Taking? Authorizing Provider  acetaminophen (TYLENOL) 500 MG tablet Take 1,000 mg by mouth every 8 (eight) hours as needed for mild pain.   Yes Historical Provider, MD  ALPRAZolam Prudy Feeler) 0.5 MG tablet Take 0.5 mg by mouth 3 (three) times daily.     Yes Historical Provider, MD  amLODipine (NORVASC) 5 MG tablet Take 5 mg by mouth every morning.    Yes Historical Provider, MD  aspirin 81 MG tablet Take 81 mg by mouth every morning.    Yes Historical Provider, MD  hydrOXYzine (ATARAX/VISTARIL) 10 MG tablet Take 10 mg by mouth at bedtime as needed for itching.   Yes Historical Provider, MD  insulin detemir (LEVEMIR) 100 UNIT/ML injection Inject 10 Units into the skin at bedtime.    Yes Historical Provider, MD  metoprolol (LOPRESSOR) 50 MG tablet Take 25 mg by mouth 2 (two) times daily.   Yes Historical  Provider, MD  PARoxetine (PAXIL) 20 MG tablet Take 10 mg by mouth 2 (two) times daily.   Yes Historical Provider, MD  Saxagliptin-Metformin (KOMBIGLYZE XR) 2.12-998 MG TB24 Take 1 tablet by mouth every morning.   Yes Historical Provider, MD  chlorpheniramine-HYDROcodone (TUSSIONEX PENNKINETIC ER) 10-8 MG/5ML LQCR Take 5 mLs by mouth at bedtime as needed for cough. Patient not taking: Reported on 11/06/2014 10/18/13   [name], NP  fluticasone [facility]) 50 MCG/ACT nasal spray Place 2 sprays into both nostrils daily. Patient not taking: Reported on 11/06/2014 10/18/13   [name], NP    No current facility-administered medications for this encounter.   Current Outpatient Prescriptions  Medication Sig Dispense Refill  . acetaminophen (TYLENOL) 500 MG tablet Take 1,000 mg by mouth every 8 (eight) hours as needed for mild pain.    Marland Kitchen ALPRAZolam (XANAX) 0.5 MG tablet Take 0.5 mg by mouth 3 (three) times daily.      Marland Kitchen amLODipine (NORVASC) 5 MG tablet Take 5 mg by mouth every morning.     Marland Kitchen aspirin 81 MG tablet Take 81 mg by mouth every morning.     . hydrOXYzine (ATARAX/VISTARIL) 10 MG tablet Take 10 mg by mouth at bedtime as needed for itching.    . insulin detemir (LEVEMIR) 100 UNIT/ML injection Inject  10 Units into the skin at bedtime.     . metoprolol (LOPRESSOR) 50 MG tablet Take 25 mg by mouth 2 (two) times daily.    Marland Kitchen. PARoxetine (PAXIL) 20 MG tablet Take 10 mg by mouth 2 (two) times daily.    . Saxagliptin-Metformin (KOMBIGLYZE XR) 2.12-998 MG TB24 Take 1 tablet by mouth every morning.    . chlorpheniramine-HYDROcodone (TUSSIONEX PENNKINETIC ER) 10-8 MG/5ML LQCR Take 5 mLs by mouth at bedtime as needed for cough. (Patient not taking: Reported on 11/06/2014) 115 mL 0  . fluticasone (FLONASE) 50 MCG/ACT nasal spray Place 2 sprays into both nostrils daily. (Patient not taking: Reported on 11/06/2014) 16 g 2    Allergies as of 11/06/2014 - Review Complete 11/06/2014  Allergen Reaction Noted  .  Penicillins Other (See Comments) 11/27/2010    Family History  Problem Relation Age of Onset  . Diabetes Mother   . Hyperlipidemia Mother     History   Social History  . Marital Status: Divorced    Spouse Name: N/A  . Number of Children: N/A  . Years of Education: N/A   Occupational History  . Not on file.   Social History Main Topics  . Smoking status: Former Smoker    Quit date: 06/03/1984  . Smokeless tobacco: Not on file  . Alcohol Use: No  . Drug Use: No  . Sexual Activity: Not on file   Other Topics Concern  . Not on file   Social History Narrative     Review of Systems: Pertinent positive and negative review of systems were noted in the above HPI section. Complete review of systems was performed and was otherwise normal.   Physical Exam: Vital signs in last 24 hours: Temp:  [97.8 F (36.6 C)] 97.8 F (36.6 C) (03/23 1612) Pulse Rate:  [63-65] 63 (03/23 2111) Resp:  [14-16] 14 (03/23 2111) BP: (132-155)/(66-74) 155/74 mmHg (03/23 2111) SpO2:  [97 %-100 %] 97 % (03/23 2111) Weight:  [182 lb (82.555 kg)] 182 lb (82.555 kg) (03/23 1612)   Constitutional: generally well-appearing Psychiatric: alert and oriented x3 Eyes: extraocular movements intact Mouth: oral pharynx moist, no lesions Neck: supple no lymphadenopathy Cardiovascular: heart regular rate and rhythm Lungs: clear to auscultation bilaterally Abdomen: soft, nontender, nondistended, no obvious ascites, no peritoneal signs, normal bowel sounds Extremities: no lower extremity edema bilaterally Skin: no lesions on visible extremities   Imaging/Other Results: Dg Abd 1 View  11/06/2014   CLINICAL DATA:  Accidentally swallowed piece of broken denture while eating hot dog. Initial encounter.  EXAM: ABDOMEN - 1 VIEW  COMPARISON:  None.  FINDINGS: An apparent 4.1 cm foreign body is noted overlying the upper abdomen, likely at the antrum of the stomach. This contains metal and additional high-density  material, and likely corresponds to the patient's denture fragment.  The visualized bowel gas pattern is unremarkable. Scattered air and stool filled loops of colon are seen; no abnormal dilatation of small bowel loops is seen to suggest small bowel obstruction. No free intra-abdominal air is identified, though evaluation for free air is limited on a single supine view.  The visualized osseous structures are within normal limits; the sacroiliac joints are unremarkable in appearance. The visualized lung bases are essentially clear.  IMPRESSION: 1. Apparent 4.1 cm denture fragment noted overlying the antrum of the stomach. 2. Unremarkable bowel gas pattern; no free intra-abdominal air seen. Small to moderate amount of stool noted in the colon.   Electronically Signed   By: Leotis ShamesJeffery  Chang M.D.   On: 11/06/2014 17:30      Impression/Plan: 69 y.o. male with accidental foreign body swallowed  Sharp object, medium sized (1/3 to 1/2 upper denture).  He understands that there are risks in attempting to remove this endoscopically, most major is esophageal perforation.  He understands that there is a risk in letting this attempt to pass from his stomach then more distally. Literature search suggests "up to 35%" perforation risk of SB or colon.  He is being admitted overnight, will attempt endoscopic removal tomorrow with him intubated.  Will allow time to ensure proper devices are available, understoon (overtube, hood, etc).  Will keep him NPO after MN.   Rachael Fee, MD  11/06/2014, 9:18 PM [facility] Pager [phone]

## 2014-11-07 NOTE — Op Note (Signed)
[facility] [address] [place], [id number]   ENDOSCOPY PROCEDURE REPORT  PATIENT: [name], Jonathan E  MR#: [id number] BIRTHDATE: 07-05-1946 , 68  yrs. old GENDER: male ENDOSCOPIST: Rachael Feeaniel P , MD PROCEDURE DATE:  11/07/2014 PROCEDURE:  EGD, diagnostic ASA CLASS:     Class II INDICATIONS:  foreign body in stomach (partial dentures), medium sized (4.1cm per xray) sharp object.  This AM, repeat films suggest at least part of the dentures have passed into RLQ (?cecum) but still possible remaining in stomach on film. MEDICATIONS: Monitored anesthesia care TOPICAL ANESTHETIC: none  DESCRIPTION OF PROCEDURE: After the risks benefits and alternatives of the procedure were thoroughly explained, informed consent was obtained.  The PENTAX GASTOROSCOPE [id number] endoscope was introduced through the mouth and advanced to the second portion of the duodenum , Without limitations.  The instrument was slowly withdrawn as the mucosa was fully examined.   EXAM: The esophagus and gastroesophageal junction were completely normal in appearance.  The stomach was entered and closely examined.The antrum, angularis, and lesser curvature were well visualized, including a retroflexed view of the cardia and fundus. The stomach wall was normally distensable.  The scope passed easily through the pylorus into the duodenum.  Retroflexed views revealed no abnormalities.     The scope was then withdrawn from the patient and the procedure completed.  COMPLICATIONS: There were no immediate complications.  ENDOSCOPIC IMPRESSION: Normal appearing esophagus and GE junction, the stomach was well visualized and normal in appearance, normal appearing duodenum  RECOMMENDATIONS: OK to discharge today.  My office will contact you to arrange repeat abdominal x-ray in 7 days to see if the dentures have passed.   eSigned:  Rachael Feeaniel P , MD 11/07/2014 11:10 AM    AC:[id number]:Faera Donell BeersByerly,  MD

## 2014-11-07 NOTE — Transfer of Care (Signed)
Immediate Anesthesia Transfer of Care Note  Patient: Jonathan Schultz  Procedure(s) Performed: Procedure(s): ESOPHAGOGASTRODUODENOSCOPY (EGD) WITH PROPOFOL (N/A)  Patient Location: PACU  Anesthesia Type:MAC  Level of Consciousness: awake, alert  and oriented  Airway & Oxygen Therapy: Patient Spontanous Breathing and Patient connected to nasal cannula oxygen  Post-op Assessment: Report given to RN and Post -op Vital signs reviewed and stable  Post vital signs: Reviewed and stable  Last Vitals:  Filed Vitals:   11/07/14 1111  BP: 137/53  Pulse:   Temp: 36.7 C  Resp: 12    Complications: No apparent anesthesia complications

## 2014-11-07 NOTE — Anesthesia Preprocedure Evaluation (Addendum)
Anesthesia Evaluation  Patient identified by MRN, date of birth, ID band Patient awake    Reviewed: Allergy & Precautions, NPO status , Patient's Chart, lab work & pertinent test results  Airway        Dental   Pulmonary former smoker,          Cardiovascular hypertension, Pt. on medications     Neuro/Psych    GI/Hepatic   Endo/Other  diabetes, Type 2  Renal/GU      Musculoskeletal   Abdominal   Peds  Hematology   Anesthesia Other Findings   Reproductive/Obstetrics                            Anesthesia Physical Anesthesia Plan  ASA: III  Anesthesia Plan: MAC   Post-op Pain Management:    Induction: Intravenous  Airway Management Planned: Nasal Cannula  Additional Equipment:   Intra-op Plan:   Post-operative Plan:   Informed Consent: I have reviewed the patients History and Physical, chart, labs and discussed the procedure including the risks, benefits and alternatives for the proposed anesthesia with the patient or authorized representative who has indicated his/her understanding and acceptance.     Plan Discussed with:   Anesthesia Plan Comments:         Anesthesia Quick Evaluation

## 2014-11-07 NOTE — Progress Notes (Signed)
Jonathan LericheGeorge E Schultz to be D/C'd Home per MD order.  Discussed with the patient and all questions fully answered.  VSS, Skin clean, dry and intact without evidence of skin break down, no evidence of skin tears noted. IV catheter discontinued intact. Site without signs and symptoms of complications. Dressing and pressure applied.  An After Visit Summary was printed and given to the patient. Patient received prescription.  D/c education completed with patient/family including follow up instructions, medication list, d/c activities limitations if indicated, with other d/c instructions as indicated by MD - patient able to verbalize understanding, all questions fully answered.   Patient instructed to return to ED, call 911, or call MD for any changes in condition.   Patient escorted via WC, and D/C home via private auto.  Jonathan Schultz,  [facility] 11/07/2014 2:30 PM

## 2014-11-07 NOTE — Telephone Encounter (Signed)
[facility]

## 2014-11-07 NOTE — Progress Notes (Signed)
Subjective: He says he had some pain last PM, but none since.  He is very comfortable.  Awaiting Endoscopy  Objective: Vital signs in last 24 hours: Temp:  [97.6 F (36.4 C)-97.8 F (36.6 C)] 97.6 F (36.4 C) (03/24 0759) Pulse Rate:  [61-68] 66 (03/24 0515) Resp:  [12-16] 12 (03/24 0515) BP: (129-157)/(66-92) 129/75 mmHg (03/24 0515) SpO2:  [97 %-100 %] 97 % (03/24 0515) Weight:  [74 kg (163 lb 2.3 oz)-82.555 kg (182 lb)] 74 kg (163 lb 2.3 oz) (03/24 0500)  NPO Afebrile, VSS Labs OK, WBC is normal abd film:  . Apparent 4.1 cm denture fragment noted overlying the antrum of the stomach. 2. Unremarkable bowel gas pattern; no free intra-abdominal air seen. Small to moderate amount of stool noted in the colon.  Intake/Output from previous day: 03/23 0701 - 03/24 0700 In: 900 [I.V.:900] Out: 900 [Urine:900] Intake/Output this shift: Total I/O In: -  Out: 375 [Urine:375]  General appearance: alert, cooperative and no distress Resp: clear to auscultation bilaterally GI: soft, non-tender; bowel sounds normal; no masses,  no organomegaly  Lab Results:   Recent Labs  11/06/14 2022 11/07/14 0313  WBC 5.4 5.7  HGB 12.7* 12.1*  HCT 38.4* 36.6*  PLT 244 230    BMET  Recent Labs  11/06/14 2022 11/07/14 0313  NA 140 140  K 4.0 3.9  CL 105 107  CO2 26 27  GLUCOSE 99 114*  BUN 9 9  CREATININE 1.10 1.01  CALCIUM 9.3 9.1   PT/INR  Recent Labs  11/07/14 0313  LABPROT 14.1  INR 1.07     Recent Labs Lab 11/07/14 0313  AST 22  ALT 16  ALKPHOS 44  BILITOT 0.5  PROT 6.4  ALBUMIN 3.6     Lipase  No results found for: LIPASE   Studies/Results: Dg Abd 1 View  11/06/2014   CLINICAL DATA:  Accidentally swallowed piece of broken denture while eating hot dog. Initial encounter.  EXAM: ABDOMEN - 1 VIEW  COMPARISON:  None.  FINDINGS: An apparent 4.1 cm foreign body is noted overlying the upper abdomen, likely at the antrum of the stomach. This contains metal  and additional high-density material, and likely corresponds to the patient's denture fragment.  The visualized bowel gas pattern is unremarkable. Scattered air and stool filled loops of colon are seen; no abnormal dilatation of small bowel loops is seen to suggest small bowel obstruction. No free intra-abdominal air is identified, though evaluation for free air is limited on a single supine view.  The visualized osseous structures are within normal limits; the sacroiliac joints are unremarkable in appearance. The visualized lung bases are essentially clear.  IMPRESSION: 1. Apparent 4.1 cm denture fragment noted overlying the antrum of the stomach. 2. Unremarkable bowel gas pattern; no free intra-abdominal air seen. Small to moderate amount of stool noted in the colon.   Electronically Signed   By: Roanna RaiderJeffery  Chang M.D.   On: 11/06/2014 17:30    Medications: . fluticasone  2 spray Each Nare Daily  . insulin aspart  0-9 Units Subcutaneous TID WC  . metoprolol  2.5 mg Intravenous 3 times per day  . sodium chloride  3 mL Intravenous Q12H   . sodium chloride 100 mL/hr at 11/07/14 16100811   Prior to Admission medications   Medication Sig Start Date End Date Taking? Authorizing Provider  acetaminophen (TYLENOL) 500 MG tablet Take 1,000 mg by mouth every 8 (eight) hours as needed for mild pain.   Yes  Historical Provider, MD  ALPRAZolam Prudy Feeler) 0.5 MG tablet Take 0.5 mg by mouth 3 (three) times daily.     Yes Historical Provider, MD  amLODipine (NORVASC) 5 MG tablet Take 5 mg by mouth every morning.    Yes Historical Provider, MD  aspirin 81 MG tablet Take 81 mg by mouth every morning.    Yes Historical Provider, MD  hydrOXYzine (ATARAX/VISTARIL) 10 MG tablet Take 10 mg by mouth at bedtime as needed for itching.   Yes Historical Provider, MD  insulin detemir (LEVEMIR) 100 UNIT/ML injection Inject 10 Units into the skin at bedtime.    Yes Historical Provider, MD  metoprolol (LOPRESSOR) 50 MG tablet Take 25 mg  by mouth 2 (two) times daily.   Yes Historical Provider, MD  PARoxetine (PAXIL) 20 MG tablet Take 10 mg by mouth 2 (two) times daily.   Yes Historical Provider, MD  Saxagliptin-Metformin (KOMBIGLYZE XR) 2.12-998 MG TB24 Take 1 tablet by mouth every morning.   Yes Historical Provider, MD  chlorpheniramine-HYDROcodone (TUSSIONEX PENNKINETIC ER) 10-8 MG/5ML LQCR Take 5 mLs by mouth at bedtime as needed for cough. Patient not taking: Reported on 11/06/2014 10/18/13   [name], NP  fluticasone [facility]) 50 MCG/ACT nasal spray Place 2 sprays into both nostrils daily. Patient not taking: Reported on 11/06/2014 10/18/13   [name], NP    Assessment/Plan Foreign body stomach (swallowed partial denture) AODM Hypertension Anxiety SCD for DVT prophylaxis   Plan:  We are on back up if endoscopy fail to retrieve his denture.      LOS: 1 day    , 11/07/2014

## 2014-11-07 NOTE — Anesthesia Postprocedure Evaluation (Signed)
  Anesthesia Post-op Note  Patient: Jonathan Schultz  Procedure(s) Performed: Procedure(s): ESOPHAGOGASTRODUODENOSCOPY (EGD) WITH PROPOFOL (N/A)  Patient Location: PACU and Endoscopy Unit  Anesthesia Type:MAC  Level of Consciousness: awake  Airway and Oxygen Therapy: Patient Spontanous Breathing and Patient connected to nasal cannula oxygen  Post-op Pain: none  Post-op Assessment: Post-op Vital signs reviewed, Patient's Cardiovascular Status Stable and Respiratory Function Stable  Post-op Vital Signs: Reviewed and stable  Last Vitals:  Filed Vitals:   11/07/14 1017  BP: 164/73  Pulse: 64  Temp: 36.8 C  Resp: 15    Complications: No apparent anesthesia complications

## 2014-11-07 NOTE — Discharge Summary (Signed)
Physician Discharge Summary  Jonathan LericheGeorge E Schultz MVH:[id number] DOB: July 31, 1946 DOA: 11/06/2014  PCP: Willey BladeEAN, ERIC, MD  Admit date: 11/06/2014 Discharge date: 11/07/2014  Time spent: 45 minutes  Recommendations for Outpatient Follow-up:  1. Dr.Jacobs in 1 week, KUB at 1 week follow up  Discharge Diagnoses:  Active Problems:   Foreign body aspiration   Essential hypertension   Diabetes   Foreign body alimentary tract   Discharge Condition: stable  Diet recommendation: diabetic  Filed Weights   11/06/14 1612 11/07/14 0500  Weight: 82.555 kg (182 lb) 74 kg (163 lb 2.3 oz)    History of present illness:  69 yo male with dm2, hypertension, apparently had part of his denture break off and swallowed it. Measures 4cm on xray. Pt denied any abd pain, Nausea, vomiting, fevers or chills.   Hospital Course:  He was admitted with a foreign body in his alimentary tract, he swallowed part of his dentures and this was noted on KUB. Subsequently seen by GI and underwent EGD this am, which revealed Normal appearing esophagus and GE junction, the stomach was well visualized and normal in appearance and normal appearing duodenum. Gi felt he was ok to discharge and recommended FU with Dr.Jacobs in 1 week with KUB at the time to Fu on the foreign body. He was asymptomatic the whole time and discharged home in a stable condition   Procedures: EGD: ENDOSCOPIC IMPRESSION: Normal appearing esophagus and GE junction, the stomach was well visualized and normal in appearance, normal appearing duodenum  Consultations:  [facility] [place]  Discharge Exam: Filed Vitals:   11/07/14 1231  BP: 155/61  Pulse: 57  Temp:   Resp: 10    General: AAOx3 Cardiovascular: S1S2/RRR Respiratory: CTAB  Discharge Instructions   Discharge Instructions    Diet - low sodium heart healthy    Complete by:  As directed      Diet Carb Modified    Complete by:  As directed      Increase activity slowly    Complete by:  As  directed           Current Discharge Medication List    START taking these medications   Details  UNABLE TO FIND This note is to excuse Mr.Newcomer from work due to medical illness requiring hospitalization from 3/23 to 3/24. Qty: 1 each, Refills: 0      CONTINUE these medications which have NOT CHANGED   Details  acetaminophen (TYLENOL) 500 MG tablet Take 1,000 mg by mouth every 8 (eight) hours as needed for mild pain.    ALPRAZolam (XANAX) 0.5 MG tablet Take 0.5 mg by mouth 3 (three) times daily.      amLODipine (NORVASC) 5 MG tablet Take 5 mg by mouth every morning.     aspirin 81 MG tablet Take 81 mg by mouth every morning.     hydrOXYzine (ATARAX/VISTARIL) 10 MG tablet Take 10 mg by mouth at bedtime as needed for itching.    insulin detemir (LEVEMIR) 100 UNIT/ML injection Inject 10 Units into the skin at bedtime.     metoprolol (LOPRESSOR) 50 MG tablet Take 25 mg by mouth 2 (two) times daily.    PARoxetine (PAXIL) 20 MG tablet Take 10 mg by mouth 2 (two) times daily.    Saxagliptin-Metformin (KOMBIGLYZE XR) 2.12-998 MG TB24 Take 1 tablet by mouth every morning.    chlorpheniramine-HYDROcodone (TUSSIONEX PENNKINETIC ER) 10-8 MG/5ML LQCR Take 5 mLs by mouth at bedtime as needed for cough. Qty: 115 mL, Refills:  0    fluticasone (FLONASE) 50 MCG/ACT nasal spray Place 2 sprays into both nostrils daily. Qty: 16 g, Refills: 2       Allergies  Allergen Reactions  . Penicillins Other (See Comments)    "Faints out"   Follow-up Information    Follow up with Rachael Fee, MD. Schedule an appointment as soon as possible for a visit in 1 week.   Specialty:  Gastroenterology   Why:  and needs Abdominal Xray   Contact information:   [address] [place] [address] [phone]       Please follow up.   Why:  needs KUB in 1 week      Follow up with August Saucer, ERIC, MD. Schedule an appointment as soon as possible for a visit in 1 week.   Specialty:  Internal Medicine    Contact information:   [address] [place] [address] [phone]        The results of significant diagnostics from this hospitalization (including imaging, microbiology, ancillary and laboratory) are listed below for reference.    Significant Diagnostic Studies: Dg Abd 1 View  11/06/2014   CLINICAL DATA:  Accidentally swallowed piece of broken denture while eating hot dog. Initial encounter.  EXAM: ABDOMEN - 1 VIEW  COMPARISON:  None.  FINDINGS: An apparent 4.1 cm foreign body is noted overlying the upper abdomen, likely at the antrum of the stomach. This contains metal and additional high-density material, and likely corresponds to the patient's denture fragment.  The visualized bowel gas pattern is unremarkable. Scattered air and stool filled loops of colon are seen; no abnormal dilatation of small bowel loops is seen to suggest small bowel obstruction. No free intra-abdominal air is identified, though evaluation for free air is limited on a single supine view.  The visualized osseous structures are within normal limits; the sacroiliac joints are unremarkable in appearance. The visualized lung bases are essentially clear.  IMPRESSION: 1. Apparent 4.1 cm denture fragment noted overlying the antrum of the stomach. 2. Unremarkable bowel gas pattern; no free intra-abdominal air seen. Small to moderate amount of stool noted in the colon.   Electronically Signed   By: Roanna Raider M.D.   On: 11/06/2014 17:30   Dg Abd 2 Views  11/07/2014   CLINICAL DATA:  69 year old male status post ingestion of denture fragment. Initial encounter.  EXAM: ABDOMEN - 2 VIEW  COMPARISON:  11/06/2014.  FINDINGS: On the supine view there is unchanged positioning of the faintly radiopaque 3-4 cm material thought to be part of the denture fragment on the comparison. This is poorly visible on the upright view.  A curvilinear metal fragment which previously projected in the right upper quadrant with the  other density now projects in the right lower quadrant on both views. This is about 18 mm in length.  The bowel gas pattern remains normal. No pneumoperitoneum. Negative lung bases. No acute osseous abnormality identified.  IMPRESSION: 1. Curvilinear metal fragments now projects in the right lower quadrant and could be in distal small bowel or the cecum. 2. Less radiopaque nodular curvilinear opacity is unchanged in the right upper quadrant, and could be either a less radiopaque component of the denture which has not progressed, or bony artifact. 3.  Normal bowel gas pattern, no free air.   Electronically Signed   By: Odessa Fleming M.D.   On: 11/07/2014 10:27    Microbiology: No results found for this or any previous visit (from the  past 240 hour(s)).   Labs: Basic Metabolic Panel:  Recent Labs Lab 11/06/14 2022 11/07/14 0313  NA 140 140  K 4.0 3.9  CL 105 107  CO2 26 27  GLUCOSE 99 114*  BUN 9 9  CREATININE 1.10 1.01  CALCIUM 9.3 9.1   Liver Function Tests:  Recent Labs Lab 11/07/14 0313  AST 22  ALT 16  ALKPHOS 44  BILITOT 0.5  PROT 6.4  ALBUMIN 3.6   No results for input(s): LIPASE, AMYLASE in the last 168 hours. No results for input(s): AMMONIA in the last 168 hours. CBC:  Recent Labs Lab 11/06/14 2022 11/07/14 0313  WBC 5.4 5.7  NEUTROABS 3.2  --   HGB 12.7* 12.1*  HCT 38.4* 36.6*  MCV 81.9 81.5  PLT 244 230   Cardiac Enzymes: No results for input(s): CKTOTAL, CKMB, CKMBINDEX, TROPONINI in the last 168 hours. BNP: BNP (last 3 results) No results for input(s): BNP in the last 8760 hours.  ProBNP (last 3 results) No results for input(s): PROBNP in the last 8760 hours.  CBG:  Recent Labs Lab 11/06/14 2225 11/07/14 0802  GLUCAP 85 122*       Signed:  ,  [facility] 11/07/2014, 1:49 PM

## 2014-11-07 NOTE — Interval H&P Note (Signed)
History and Physical Interval Note:  11/07/2014 10:38 AM  Jonathan Schultz  has presented today for surgery, with the diagnosis of sharp, medium sized foreign body in stomach  The various methods of treatment have been discussed with the patient and family. After consideration of risks, benefits and other options for treatment, the patient has consented to  Procedure(s): ESOPHAGOGASTRODUODENOSCOPY (EGD) WITH PROPOFOL (N/A) as a surgical intervention .  The patient's history has been reviewed, patient examined, no change in status, stable for surgery.  I have reviewed the patient's chart and labs.  Questions were answered to the patient's satisfaction.     Rachael FeeJacobs, Daniel P

## 2014-11-08 LAB — HEMOGLOBIN A1C
HEMOGLOBIN A1C: 7.4 % — AB (ref 4.8–5.6)
Mean Plasma Glucose: 166 mg/dL

## 2014-11-11 ENCOUNTER — Encounter (HOSPITAL_COMMUNITY): Payer: Self-pay | Admitting: Gastroenterology

## 2014-11-14 ENCOUNTER — Ambulatory Visit (INDEPENDENT_AMBULATORY_CARE_PROVIDER_SITE_OTHER)
Admission: RE | Admit: 2014-11-14 | Discharge: 2014-11-14 | Disposition: A | Discharge status: Home / Self Care | Payer: Medicare Other | Source: Ambulatory Visit | Attending: Gastroenterology | Admitting: Gastroenterology

## 2014-11-14 ENCOUNTER — Telehealth: Payer: Self-pay

## 2014-11-14 DIAGNOSIS — T189XXD Foreign body of alimentary tract, part unspecified, subsequent encounter: Secondary | ICD-10-CM | POA: Diagnosis not present

## 2014-11-14 NOTE — Telephone Encounter (Signed)
Pt has been notified and will come in this afternoon

## 2014-11-14 NOTE — Telephone Encounter (Signed)
-----   Message from Donata DuffPatty L Lewis, [place] sent at 11/07/2014 12:41 PM EDT ----- Pt needs KUB see 11/07/14 note

## 2014-12-20 ENCOUNTER — Emergency Department (HOSPITAL_COMMUNITY): Payer: Medicare Other

## 2014-12-20 ENCOUNTER — Encounter (HOSPITAL_COMMUNITY): Payer: Self-pay | Admitting: Emergency Medicine

## 2014-12-20 ENCOUNTER — Emergency Department (HOSPITAL_COMMUNITY)
Admission: EM | Admit: 2014-12-20 | Discharge: 2014-12-20 | Disposition: A | Discharge status: Home / Self Care | Payer: Medicare Other | Attending: Emergency Medicine | Admitting: Emergency Medicine

## 2014-12-20 DIAGNOSIS — Z87891 Personal history of nicotine dependence: Secondary | ICD-10-CM | POA: Diagnosis not present

## 2014-12-20 DIAGNOSIS — I10 Essential (primary) hypertension: Secondary | ICD-10-CM | POA: Insufficient documentation

## 2014-12-20 DIAGNOSIS — W1839XA Other fall on same level, initial encounter: Secondary | ICD-10-CM | POA: Insufficient documentation

## 2014-12-20 DIAGNOSIS — Z7951 Long term (current) use of inhaled steroids: Secondary | ICD-10-CM | POA: Diagnosis not present

## 2014-12-20 DIAGNOSIS — R05 Cough: Secondary | ICD-10-CM

## 2014-12-20 DIAGNOSIS — R531 Weakness: Secondary | ICD-10-CM | POA: Diagnosis present

## 2014-12-20 DIAGNOSIS — F419 Anxiety disorder, unspecified: Secondary | ICD-10-CM | POA: Diagnosis not present

## 2014-12-20 DIAGNOSIS — Z87438 Personal history of other diseases of male genital organs: Secondary | ICD-10-CM | POA: Diagnosis not present

## 2014-12-20 DIAGNOSIS — Y998 Other external cause status: Secondary | ICD-10-CM | POA: Diagnosis not present

## 2014-12-20 DIAGNOSIS — E1165 Type 2 diabetes mellitus with hyperglycemia: Secondary | ICD-10-CM | POA: Insufficient documentation

## 2014-12-20 DIAGNOSIS — Z79899 Other long term (current) drug therapy: Secondary | ICD-10-CM | POA: Diagnosis not present

## 2014-12-20 DIAGNOSIS — Y9389 Activity, other specified: Secondary | ICD-10-CM | POA: Diagnosis not present

## 2014-12-20 DIAGNOSIS — R630 Anorexia: Secondary | ICD-10-CM | POA: Insufficient documentation

## 2014-12-20 DIAGNOSIS — R634 Abnormal weight loss: Secondary | ICD-10-CM

## 2014-12-20 DIAGNOSIS — N179 Acute kidney failure, unspecified: Secondary | ICD-10-CM | POA: Diagnosis not present

## 2014-12-20 DIAGNOSIS — E860 Dehydration: Secondary | ICD-10-CM | POA: Insufficient documentation

## 2014-12-20 DIAGNOSIS — W19XXXA Unspecified fall, initial encounter: Secondary | ICD-10-CM

## 2014-12-20 DIAGNOSIS — R059 Cough, unspecified: Secondary | ICD-10-CM

## 2014-12-20 DIAGNOSIS — Z880 Allergy status to penicillin: Secondary | ICD-10-CM | POA: Diagnosis not present

## 2014-12-20 DIAGNOSIS — Z791 Long term (current) use of non-steroidal anti-inflammatories (NSAID): Secondary | ICD-10-CM | POA: Insufficient documentation

## 2014-12-20 DIAGNOSIS — R739 Hyperglycemia, unspecified: Secondary | ICD-10-CM

## 2014-12-20 DIAGNOSIS — Y9289 Other specified places as the place of occurrence of the external cause: Secondary | ICD-10-CM | POA: Diagnosis not present

## 2014-12-20 DIAGNOSIS — Y92009 Unspecified place in unspecified non-institutional (private) residence as the place of occurrence of the external cause: Secondary | ICD-10-CM

## 2014-12-20 LAB — CBC WITH DIFFERENTIAL/PLATELET
BASOS ABS: 0 10*3/uL (ref 0.0–0.1)
Basophils Relative: 0 % (ref 0–1)
EOS PCT: 2 % (ref 0–5)
Eosinophils Absolute: 0.1 10*3/uL (ref 0.0–0.7)
HCT: 41.3 % (ref 39.0–52.0)
Hemoglobin: 13.5 g/dL (ref 13.0–17.0)
Lymphocytes Relative: 28 % (ref 12–46)
Lymphs Abs: 1.4 10*3/uL (ref 0.7–4.0)
MCH: 26.9 pg (ref 26.0–34.0)
MCHC: 32.7 g/dL (ref 30.0–36.0)
MCV: 82.3 fL (ref 78.0–100.0)
Monocytes Absolute: 0.3 10*3/uL (ref 0.1–1.0)
Monocytes Relative: 7 % (ref 3–12)
Neutro Abs: 3.3 10*3/uL (ref 1.7–7.7)
Neutrophils Relative %: 63 % (ref 43–77)
PLATELETS: 268 10*3/uL (ref 150–400)
RBC: 5.02 MIL/uL (ref 4.22–5.81)
RDW: 13.4 % (ref 11.5–15.5)
WBC: 5.2 10*3/uL (ref 4.0–10.5)

## 2014-12-20 LAB — URINALYSIS, ROUTINE W REFLEX MICROSCOPIC
Bilirubin Urine: NEGATIVE
Glucose, UA: NEGATIVE mg/dL
Hgb urine dipstick: NEGATIVE
KETONES UR: NEGATIVE mg/dL
NITRITE: NEGATIVE
PROTEIN: NEGATIVE mg/dL
Specific Gravity, Urine: 1.033 — ABNORMAL HIGH (ref 1.005–1.030)
Urobilinogen, UA: 0.2 mg/dL (ref 0.0–1.0)
pH: 6.5 (ref 5.0–8.0)

## 2014-12-20 LAB — CBG MONITORING, ED: Glucose-Capillary: 204 mg/dL — ABNORMAL HIGH (ref 70–99)

## 2014-12-20 LAB — URINE MICROSCOPIC-ADD ON

## 2014-12-20 LAB — COMPREHENSIVE METABOLIC PANEL
ALBUMIN: 4.2 g/dL (ref 3.5–5.0)
ALT: 21 U/L (ref 17–63)
AST: 36 U/L (ref 15–41)
Alkaline Phosphatase: 51 U/L (ref 38–126)
Anion gap: 7 (ref 5–15)
BUN: 15 mg/dL (ref 6–20)
CHLORIDE: 102 mmol/L (ref 101–111)
CO2: 27 mmol/L (ref 22–32)
Calcium: 9.4 mg/dL (ref 8.9–10.3)
Creatinine, Ser: 1.32 mg/dL — ABNORMAL HIGH (ref 0.61–1.24)
GFR calc Af Amer: >60 mL/min (ref >60)
GFR calc non Af Amer: 54 mL/min — ABNORMAL LOW (ref >60)
Glucose, Bld: 217 mg/dL — ABNORMAL HIGH (ref 70–99)
POTASSIUM: 4.9 mmol/L (ref 3.5–5.1)
Sodium: 136 mmol/L (ref 135–145)
Total Bilirubin: 0.7 mg/dL (ref 0.3–1.2)
Total Protein: 7.4 g/dL (ref 6.5–8.1)

## 2014-12-20 LAB — TROPONIN I

## 2014-12-20 LAB — LIPASE, BLOOD: Lipase: 23 U/L (ref 22–51)

## 2014-12-20 LAB — BRAIN NATRIURETIC PEPTIDE: B Natriuretic Peptide: 15.7 pg/mL (ref 0.0–100.0)

## 2014-12-20 MED ORDER — SODIUM CHLORIDE 0.9 % IV BOLUS (SEPSIS)
1000.0000 mL | Freq: Once | INTRAVENOUS | Status: AC
  Administered 2014-12-20: 1000 mL via INTRAVENOUS

## 2014-12-20 MED ORDER — DIPHENHYDRAMINE HCL 50 MG/ML IJ SOLN
25.0000 mg | Freq: Once | INTRAMUSCULAR | Status: AC
  Administered 2014-12-20: 25 mg via INTRAVENOUS
  Filled 2014-12-20: qty 1

## 2014-12-20 MED ORDER — METOCLOPRAMIDE HCL 5 MG/ML IJ SOLN
10.0000 mg | Freq: Once | INTRAMUSCULAR | Status: AC
  Administered 2014-12-20: 10 mg via INTRAVENOUS
  Filled 2014-12-20: qty 2

## 2014-12-20 NOTE — ED Provider Notes (Signed)
CSN: [id number]     Arrival date & time 12/20/14  [id number] History   None    Chief Complaint  Patient presents with  . Weakness     (Consider location/radiation/quality/duration/timing/severity/associated sxs/prior Treatment) HPI  Jonathan Schultz is a 69 y.o. male with PMH significant for vitamin D deficiency, insulin-dependent diabetes, complaining of generalized weakness, onset 2 days ago, associated with 2 falls this morning.  There was no loss of consciousness, no head trauma, no anticoagulation.  Patient states that he just felt weak and lightheaded.    He had a 6 out of 10 epigastric abdominal pain this morning which is resolved, he also has a 7 out of 10 frontal headache described as throbbing with no exacerbating or alleviating symptoms, which he thinks is secondary to not eating and drinking.  Patient has had no appetite for the last 2 days.  He has a dry cough which is had for 2 months. Has seent ENT Annalee GentaShoemaker and Dx with reflux, started on reglan and nexium. States that when he tries to eat sometimes so bad that he regurgitates.   On review of systems he notes a generally shortness of breath is not worse on exertion. Patient denies chest pain, increasing peripheral edema, fever/chills, easy bruising or bleeding.   Past Medical History  Diagnosis Date  . Acute prostatitis   . Type II or unspecified type diabetes mellitus without mention of complication, not stated as uncontrolled   . Essential hypertension, malignant   . Unspecified vitamin D deficiency   . Urethral discharge   . Anxiety    Past Surgical History  Procedure Laterality Date  . Rotator cuff repair    . Wisdom tooth extraction    . Esophagogastroduodenoscopy (egd) with propofol N/A 11/07/2014    Procedure: ESOPHAGOGASTRODUODENOSCOPY (EGD) WITH PROPOFOL;  Surgeon: Rachael Feeaniel P Jacobs, MD;  [facility];  Service: Endoscopy;  Laterality: N/A;   Family History  Problem Relation Age of Onset  . Diabetes Mother   .  Hyperlipidemia Mother    History  Substance Use Topics  . Smoking status: Former Smoker    Quit date: 06/03/1984  . Smokeless tobacco: Not on file  . Alcohol Use: No    Review of Systems  10 systems reviewed and found to be negative, except as noted in the HPI.  Allergies  Penicillins  Home Medications   Prior to Admission medications   Medication Sig Start Date End Date Taking? Authorizing Provider  acetaminophen (TYLENOL) 500 MG tablet Take 1,000 mg by mouth every 8 (eight) hours as needed for mild pain.   Yes Historical Provider, MD  ALPRAZolam Prudy Feeler(XANAX) 0.5 MG tablet Take 0.5 mg by mouth 3 (three) times daily.     Yes Historical Provider, MD  amLODipine (NORVASC) 5 MG tablet Take 5 mg by mouth every morning.    Yes Historical Provider, MD  esomeprazole (NEXIUM) 40 MG capsule Take 1 capsule by mouth daily. 11/04/14  Yes Historical Provider, MD  fluticasone (FLONASE) 50 MCG/ACT nasal spray Place 2 sprays into both nostrils daily. 10/18/13  Yes Irish EldersKelly Walker, NP  hydrOXYzine (ATARAX/VISTARIL) 10 MG tablet Take 10 mg by mouth at bedtime as needed for itching.   Yes Historical Provider, MD  meloxicam (MOBIC) 15 MG tablet Take 1 tablet by mouth daily. 12/16/14  Yes Historical Provider, MD  metoCLOPramide (REGLAN) 10 MG tablet Take 1 tablet by mouth 3 (three) times daily. 12/16/14  Yes Historical Provider, MD  metoprolol (LOPRESSOR) 50 MG tablet Take 25  mg by mouth daily.    Yes Historical Provider, MD  Multiple Vitamin (MULTIVITAMIN WITH MINERALS) TABS tablet Take 1 tablet by mouth daily.   Yes Historical Provider, MD  Saxagliptin-Metformin (KOMBIGLYZE XR) 2.12-998 MG TB24 Take 1 tablet by mouth every morning.   Yes Historical Provider, MD  chlorpheniramine-HYDROcodone (TUSSIONEX PENNKINETIC ER) 10-8 MG/5ML LQCR Take 5 mLs by mouth at bedtime as needed for cough. Patient not taking: Reported on 11/06/2014 10/18/13   Irish EldersKelly Walker, NP  UNABLE TO FIND This note is to excuse Jonathan Schultz from work due to  medical illness requiring hospitalization from 3/23 to 3/24. 11/07/14   Zannie CovePreetha Joseph, MD   BP 120/58 mmHg  Pulse 55  Temp(Src) 97.8 F (36.6 C) (Oral)  Resp 15  SpO2 97% Physical Exam  Constitutional: He is oriented to person, place, and time. He appears well-developed and well-nourished. No distress.  HENT:  Head: Normocephalic and atraumatic.  Mouth/Throat: Oropharynx is clear and moist.  Eyes: Conjunctivae and EOM are normal. Pupils are equal, round, and reactive to light.  Neck: Normal range of motion. Neck supple.  Cardiovascular: Normal rate, regular rhythm and intact distal pulses.   Pulmonary/Chest: Effort normal and breath sounds normal. No stridor.  Abdominal: Soft. Bowel sounds are normal. He exhibits no distension and no mass. There is no tenderness. There is no rebound and no guarding.  Musculoskeletal: Normal range of motion. He exhibits no edema or tenderness.  Neurological: He is alert and oriented to person, place, and time.  Skin: He is not diaphoretic.  Psychiatric: He has a normal mood and affect.  Nursing note and vitals reviewed.   ED Course  Procedures (including critical care time) Labs Review Labs Reviewed  URINALYSIS, ROUTINE W REFLEX MICROSCOPIC - Abnormal; Notable for the following:    Color, Urine AMBER (*)    APPearance CLOUDY (*)    Specific Gravity, Urine 1.033 (*)    Leukocytes, UA TRACE (*)    All other components within normal limits  COMPREHENSIVE METABOLIC PANEL - Abnormal; Notable for the following:    Glucose, Bld 217 (*)    Creatinine, Ser 1.32 (*)    GFR calc non Af Amer 54 (*)    All other components within normal limits  URINE MICROSCOPIC-ADD ON - Abnormal; Notable for the following:    Bacteria, UA FEW (*)    Casts HYALINE CASTS (*)    All other components within normal limits  CBG MONITORING, ED - Abnormal; Notable for the following:    Glucose-Capillary 204 (*)    All other components within normal limits  LIPASE, BLOOD   CBC WITH DIFFERENTIAL/PLATELET  BRAIN NATRIURETIC PEPTIDE  TROPONIN I    Imaging Review Dg Chest 2 View  12/20/2014   CLINICAL DATA:  Cough and weakness for 2 days  EXAM: CHEST  2 VIEW  COMPARISON:  10/08/2013  FINDINGS: The heart size and mediastinal contours are within normal limits. Both lungs are clear. The visualized skeletal structures are unremarkable.  IMPRESSION: No active cardiopulmonary disease.   Electronically Signed   By: Ellery Plunkaniel R Mitchell M.D.   On: 12/20/2014 06:43     EKG Interpretation   Date/Time:  Friday Dec 20 2014 05:35:22 EDT Ventricular Rate:  62 PR Interval:  167 QRS Duration: 86 QT Interval:  384 QTC Calculation: 390 R Axis:   67 Text Interpretation:  Sinus rhythm Baseline wander Confirmed by OTTER  MD,  OLGA ([id number]) on 12/20/2014 5:55:29 AM      MDM  Final diagnoses:  Cough  Weakness  Dehydration  AKI (acute kidney injury)  Hyperglycemia without ketosis  Unintentional weight loss  Fall at home, initial encounter    Filed Vitals:   12/20/14 0524 12/20/14 0722 12/20/14 0730 12/20/14 0800  BP: 108/64 141/72 130/68 120/58  Pulse: 63 60 53 55  Temp: 97.8 F (36.6 C)     TempSrc: Oral     Resp: SpO2: 98% 98% 96% 97%    Medications  sodium chloride 0.9 % bolus 1,000 mL (0 mLs Intravenous Stopped 12/20/14 0837)  metoCLOPramide (REGLAN) injection 10 mg (10 mg Intravenous Given 12/20/14 0639)  diphenhydrAMINE (BENADRYL) injection 25 mg (25 mg Intravenous Given 12/20/14 0454)    Jonathan Schultz is a pleasant 69 y.o. male presenting with generalized weakness worsening over the course of several days patient had 2 falls this morning with no head trauma or LOC. Urine is concentrated with a specific gravity of 1.033. EKG normal sinus rhythm, patient has a creatinine bump of 1.32, he's normally around 1. Blood work otherwise unremarkable except for mildly elevated blood glucose at 204 with no anion gap. ProBNP chest x-ray are normal. Patient will  be given fluid bolus, check orthostatics. He also reports an unintentional weight loss over the last 2 months, encouraged him to follow closely with his primary care physician on this. Advised a soft liquid diet and push fluids, supplementation with ensure.  This is a shared visit with the attending physician who personally evaluated the patient and agrees with the care plan.   Evaluation does not show pathology that would require ongoing emergent intervention or inpatient treatment. Pt is hemodynamically stable and mentating appropriately. Discussed findings and plan with patient/guardian, who agrees with care plan. All questions answered. Return precautions discussed and outpatient follow up given.       Wynetta Emery, PA-C 12/20/14 0981  Richardean Canal, MD 12/23/14 [phone]

## 2014-12-20 NOTE — ED Notes (Signed)
Pt states he got off work Wednesday because he did not feel well and went home  Pt states he felt weak all over and Wednesday evening he started to have vomiting  Pt states he called his dr on Thursday but he could not be seen  Pt states he got up this morning to go to work and could not make it  Pt states he has not eaten anything in 2 days  Pt states he took his medication this morning and has kept it down  Pt states when he got out of bed this morning he fell and then he fell again in the shower  Pt denies injury from falls

## 2014-12-20 NOTE — Discharge Instructions (Signed)
Push fluids: take small frequent sips of water or Gatorade, do not drink any soda, juice or caffeinated beverages.    Try to have a soft diet supplement your nutrition with ensure.  Please follow with your primary care doctor in the next 2 days for a check-up. They must obtain records for further management.   Do not hesitate to return to the Emergency Department for any new, worsening or concerning symptoms.

## 2015-01-08 ENCOUNTER — Encounter (HOSPITAL_COMMUNITY): Payer: Self-pay

## 2015-01-08 ENCOUNTER — Emergency Department (HOSPITAL_COMMUNITY)
Admission: EM | Admit: 2015-01-08 | Discharge: 2015-01-08 | Disposition: A | Discharge status: Home / Self Care | Payer: Medicare Other | Attending: Emergency Medicine | Admitting: Emergency Medicine

## 2015-01-08 DIAGNOSIS — Z87891 Personal history of nicotine dependence: Secondary | ICD-10-CM | POA: Insufficient documentation

## 2015-01-08 DIAGNOSIS — Z880 Allergy status to penicillin: Secondary | ICD-10-CM | POA: Diagnosis not present

## 2015-01-08 DIAGNOSIS — F419 Anxiety disorder, unspecified: Secondary | ICD-10-CM | POA: Insufficient documentation

## 2015-01-08 DIAGNOSIS — Z87448 Personal history of other diseases of urinary system: Secondary | ICD-10-CM | POA: Insufficient documentation

## 2015-01-08 DIAGNOSIS — I10 Essential (primary) hypertension: Secondary | ICD-10-CM | POA: Insufficient documentation

## 2015-01-08 DIAGNOSIS — Z7982 Long term (current) use of aspirin: Secondary | ICD-10-CM | POA: Diagnosis not present

## 2015-01-08 DIAGNOSIS — E119 Type 2 diabetes mellitus without complications: Secondary | ICD-10-CM | POA: Diagnosis not present

## 2015-01-08 DIAGNOSIS — Z79899 Other long term (current) drug therapy: Secondary | ICD-10-CM | POA: Diagnosis not present

## 2015-01-08 DIAGNOSIS — R195 Other fecal abnormalities: Secondary | ICD-10-CM | POA: Insufficient documentation

## 2015-01-08 NOTE — ED Notes (Signed)
Pt states he woke up this am and his stool was a greenish-blue color, no blood, he states that he doesn't feel bad at all and hasn't been sick

## 2015-01-08 NOTE — ED Provider Notes (Signed)
CSN: [id number]     Arrival date & time 01/08/15  [id number] History   First MD Initiated Contact with Patient 01/08/15 [phone]     Chief Complaint  Patient presents with  . Melena    HPI Patient presents to the emergency department with complaints of blue green colored stool.  Denies gross blood.  Denies weakness.  No use of anticoagulants.  Is on 81 mg aspirin.  No other complaints.  No history of significant GI bleeding.   Past Medical History  Diagnosis Date  . Acute prostatitis   . Type II or unspecified type diabetes mellitus without mention of complication, not stated as uncontrolled   . Essential hypertension, malignant   . Unspecified vitamin D deficiency   . Urethral discharge   . Anxiety    Past Surgical History  Procedure Laterality Date  . Rotator cuff repair    . Wisdom tooth extraction    . Esophagogastroduodenoscopy (egd) with propofol N/A 11/07/2014    Procedure: ESOPHAGOGASTRODUODENOSCOPY (EGD) WITH PROPOFOL;  Surgeon: Rachael Feeaniel P Jacobs, MD;  Location: [facility] ENDOSCOPY;  Service: Endoscopy;  Laterality: N/A;   Family History  Problem Relation Age of Onset  . Diabetes Mother   . Hyperlipidemia Mother    History  Substance Use Topics  . Smoking status: Former Smoker    Quit date: 06/03/1984  . Smokeless tobacco: Not on file  . Alcohol Use: No    Review of Systems  All other systems reviewed and are negative.     Allergies  Penicillins  Home Medications   Prior to Admission medications   Medication Sig Start Date End Date Taking? Authorizing Provider  ALPRAZolam Prudy Feeler(XANAX) 0.5 MG tablet Take 0.5 mg by mouth 3 (three) times daily.     Yes Historical Provider, MD  amLODipine (NORVASC) 5 MG tablet Take 5 mg by mouth every morning.    Yes Historical Provider, MD  aspirin 81 MG chewable tablet Chew 81 mg by mouth daily.   Yes Historical Provider, MD  esomeprazole (NEXIUM) 40 MG capsule Take 1 capsule by mouth daily. 11/04/14  Yes Historical Provider, MD  hydrOXYzine  (ATARAX/VISTARIL) 10 MG tablet Take 10 mg by mouth at bedtime as needed for itching.   Yes Historical Provider, MD  ibuprofen (ADVIL,MOTRIN) 200 MG tablet Take 400 mg by mouth every 6 (six) hours as needed for moderate pain.   Yes Historical Provider, MD  meloxicam (MOBIC) 15 MG tablet Take 1 tablet by mouth daily. 12/16/14  Yes Historical Provider, MD  metoCLOPramide (REGLAN) 10 MG tablet Take 1 tablet by mouth 3 (three) times daily. 12/16/14  Yes Historical Provider, MD  metoprolol (LOPRESSOR) 50 MG tablet Take 25 mg by mouth daily.    Yes Historical Provider, MD  Multiple Vitamin (MULTIVITAMIN WITH MINERALS) TABS tablet Take 1 tablet by mouth daily.   Yes Historical Provider, MD  Saxagliptin-Metformin (KOMBIGLYZE XR) 2.12-998 MG TB24 Take 1 tablet by mouth every morning.   Yes Historical Provider, MD  chlorpheniramine-HYDROcodone (TUSSIONEX PENNKINETIC ER) 10-8 MG/5ML LQCR Take 5 mLs by mouth at bedtime as needed for cough. Patient not taking: Reported on 11/06/2014 10/18/13   Irish EldersKelly Walker, NP  fluticasone [facility](FLONASE) 50 MCG/ACT nasal spray Place 2 sprays into both nostrils daily. Patient taking differently: Place 2 sprays into both nostrils daily as needed for allergies.  10/18/13   Irish EldersKelly Walker, NP  selenium sulfide (SELSUN) 2.5 % shampoo Apply 1 application topically daily. For 5 days 12/24/14   Historical Provider, MD  UNABLE TO  FIND This note is to excuse Mr.Fayson from work due to medical illness requiring hospitalization from 3/23 to 3/24. 11/07/14   Zannie Cove, MD   BP 128/58 mmHg  Pulse 59  Temp(Src) 97.7 F (36.5 C) (Oral)  Resp 20  Ht  (1.753 m)  Wt 174 lb (78.926 kg)  BMI 25.68 kg/m2  SpO2 100% Physical Exam  Constitutional: He is oriented to person, place, and time. He appears well-developed and well-nourished.  HENT:  Head: Normocephalic.  Eyes: EOM are normal.  Neck: Normal range of motion.  Pulmonary/Chest: Effort normal.  Abdominal: He exhibits no distension. There is no  tenderness.  Genitourinary:  No gross blood, blue green stool color. Hemoccult negative   Musculoskeletal: Normal range of motion.  Neurological: He is alert and oriented to person, place, and time.  Psychiatric: He has a normal mood and affect.  Nursing note and vitals reviewed.   ED Course  Procedures (including critical care time) Labs Review Labs Reviewed - No data to display  Imaging Review No results found.   EKG Interpretation None      MDM   Final diagnoses:  Abnormal stool color    Abnormal stool color.  Normal vital signs.  No gross blood.  Hemoccult negative.    Azalia Bilis, MD 01/08/15 0730

## 2015-09-14 ENCOUNTER — Encounter (HOSPITAL_COMMUNITY): Payer: Self-pay

## 2015-09-14 ENCOUNTER — Emergency Department (HOSPITAL_COMMUNITY): Payer: Medicare Other

## 2015-09-14 ENCOUNTER — Emergency Department (HOSPITAL_COMMUNITY)
Admission: EM | Admit: 2015-09-14 | Discharge: 2015-09-14 | Disposition: A | Discharge status: Home / Self Care | Payer: Medicare Other | Attending: Emergency Medicine | Admitting: Emergency Medicine

## 2015-09-14 DIAGNOSIS — Z791 Long term (current) use of non-steroidal anti-inflammatories (NSAID): Secondary | ICD-10-CM | POA: Insufficient documentation

## 2015-09-14 DIAGNOSIS — Z87438 Personal history of other diseases of male genital organs: Secondary | ICD-10-CM | POA: Insufficient documentation

## 2015-09-14 DIAGNOSIS — R1011 Right upper quadrant pain: Secondary | ICD-10-CM | POA: Diagnosis not present

## 2015-09-14 DIAGNOSIS — R109 Unspecified abdominal pain: Secondary | ICD-10-CM

## 2015-09-14 DIAGNOSIS — Z7982 Long term (current) use of aspirin: Secondary | ICD-10-CM | POA: Insufficient documentation

## 2015-09-14 DIAGNOSIS — F419 Anxiety disorder, unspecified: Secondary | ICD-10-CM | POA: Diagnosis not present

## 2015-09-14 DIAGNOSIS — Z87891 Personal history of nicotine dependence: Secondary | ICD-10-CM | POA: Insufficient documentation

## 2015-09-14 DIAGNOSIS — I10 Essential (primary) hypertension: Secondary | ICD-10-CM | POA: Diagnosis not present

## 2015-09-14 DIAGNOSIS — Z79899 Other long term (current) drug therapy: Secondary | ICD-10-CM | POA: Insufficient documentation

## 2015-09-14 DIAGNOSIS — E119 Type 2 diabetes mellitus without complications: Secondary | ICD-10-CM | POA: Insufficient documentation

## 2015-09-14 DIAGNOSIS — Z880 Allergy status to penicillin: Secondary | ICD-10-CM | POA: Diagnosis not present

## 2015-09-14 LAB — COMPREHENSIVE METABOLIC PANEL
ALK PHOS: 63 U/L (ref 38–126)
ALT: 50 U/L (ref 17–63)
AST: 141 U/L — ABNORMAL HIGH (ref 15–41)
Albumin: 4.3 g/dL (ref 3.5–5.0)
Anion gap: 12 (ref 5–15)
BUN: 19 mg/dL (ref 6–20)
CALCIUM: 9.4 mg/dL (ref 8.9–10.3)
CO2: 28 mmol/L (ref 22–32)
CREATININE: 1.27 mg/dL — AB (ref 0.61–1.24)
Chloride: 99 mmol/L — ABNORMAL LOW (ref 101–111)
GFR calc non Af Amer: 56 mL/min — ABNORMAL LOW (ref >60)
Glucose, Bld: 221 mg/dL — ABNORMAL HIGH (ref 65–99)
Potassium: 4.5 mmol/L (ref 3.5–5.1)
SODIUM: 139 mmol/L (ref 135–145)
TOTAL PROTEIN: 7.6 g/dL (ref 6.5–8.1)
Total Bilirubin: 1 mg/dL (ref 0.3–1.2)

## 2015-09-14 LAB — URINALYSIS, ROUTINE W REFLEX MICROSCOPIC
Bilirubin Urine: NEGATIVE
Glucose, UA: 500 mg/dL — AB
HGB URINE DIPSTICK: NEGATIVE
Ketones, ur: NEGATIVE mg/dL
Leukocytes, UA: NEGATIVE
NITRITE: NEGATIVE
PROTEIN: NEGATIVE mg/dL
SPECIFIC GRAVITY, URINE: 1.021 (ref 1.005–1.030)
pH: 7 (ref 5.0–8.0)

## 2015-09-14 LAB — CBC
HCT: 39.1 % (ref 39.0–52.0)
HEMOGLOBIN: 12.6 g/dL — AB (ref 13.0–17.0)
MCH: 25.9 pg — AB (ref 26.0–34.0)
MCHC: 32.2 g/dL (ref 30.0–36.0)
MCV: 80.5 fL (ref 78.0–100.0)
PLATELETS: 255 10*3/uL (ref 150–400)
RBC: 4.86 MIL/uL (ref 4.22–5.81)
RDW: 13.4 % (ref 11.5–15.5)
WBC: 8.7 10*3/uL (ref 4.0–10.5)

## 2015-09-14 LAB — LIPASE, BLOOD: Lipase: 100 U/L — ABNORMAL HIGH (ref 11–51)

## 2015-09-14 LAB — I-STAT TROPONIN, ED: Troponin i, poc: 0.01 ng/mL (ref 0.00–0.08)

## 2015-09-14 MED ORDER — IOHEXOL 300 MG/ML  SOLN
100.0000 mL | Freq: Once | INTRAMUSCULAR | Status: AC | PRN
  Administered 2015-09-14: 100 mL via INTRAVENOUS

## 2015-09-14 MED ORDER — IOHEXOL 300 MG/ML  SOLN
25.0000 mL | Freq: Once | INTRAMUSCULAR | Status: AC | PRN
  Administered 2015-09-14: 25 mL via ORAL

## 2015-09-14 MED ORDER — SODIUM CHLORIDE 0.9 % IV BOLUS (SEPSIS)
1000.0000 mL | Freq: Once | INTRAVENOUS | Status: AC
  Administered 2015-09-14: 1000 mL via INTRAVENOUS

## 2015-09-14 NOTE — Discharge Instructions (Signed)
Please read and follow all provided instructions.  Your diagnoses today include:  1. RUQ abdominal pain   2. Abdominal pain    Tests performed today include:  Blood counts and electrolytes  Blood tests to check liver and kidney function  Blood tests to check pancreas function  Urine test to look for infection and pregnancy (in women)  Vital signs. See below for your results today.   Medications prescribed:   Take any prescribed medications only as directed.  Home care instructions:   Follow any educational materials contained in this packet.  Follow-up instructions: Please follow-up with your primary care provider in the next 2 days for further evaluation of your symptoms. You will need a referral to a General Surgeon for further evaluation of your abdominal pain. Please come back if you have worsening abdominal pain, nausea or vomiting. You have a stone that may obstruct your gallbladder and will need surgical intervention.     Return instructions:  SEEK IMMEDIATE MEDICAL ATTENTION IF:  The pain does not go away or becomes severe   A temperature above 101F develops   Repeated vomiting occurs (multiple episodes)   The pain becomes localized to portions of the abdomen. The right side could possibly be appendicitis. In an adult, the left lower portion of the abdomen could be colitis or diverticulitis.   Blood is being passed in stools or vomit (bright red or black tarry stools)   You develop chest pain, difficulty breathing, dizziness or fainting, or become confused, poorly responsive, or inconsolable (young children)  If you have any other emergent concerns regarding your health  Additional Information: Abdominal (belly) pain can be caused by many things. Your caregiver performed an examination and possibly ordered blood/urine tests and imaging (CT scan, x-rays, ultrasound). Many cases can be observed and treated at home after initial evaluation in the emergency  department. Even though you are being discharged home, abdominal pain can be unpredictable. Therefore, you need a repeated exam if your pain does not resolve, returns, or worsens. Most patients with abdominal pain don't have to be admitted to the hospital or have surgery, but serious problems like appendicitis and gallbladder attacks can start out as nonspecific pain. Many abdominal conditions cannot be diagnosed in one visit, so follow-up evaluations are very important.  Your vital signs today were: BP 131/69 mmHg   Pulse 89   Temp(Src) 98.5 F (36.9 C) (Oral)   Resp 21   SpO2 97% If your blood pressure (bp) was elevated above 135/85 this visit, please have this repeated by your doctor within one month. --------------

## 2015-09-14 NOTE — ED Notes (Signed)
US at bedside

## 2015-09-14 NOTE — ED Notes (Signed)
Dr Madilyn Hook and PA Joselyn Glassman at the bedside

## 2015-09-14 NOTE — ED Provider Notes (Signed)
CSN: [id number]     Arrival date & time 09/14/15  1022 History   First MD Initiated Contact with Patient 09/14/15 1110     Chief Complaint  Patient presents with  . Abdominal Pain  . Nausea   (Consider location/radiation/quality/duration/timing/severity/associated sxs/prior Treatment) HPI 70 y.o. male with a hx of HTN, DM, presents to the Emergency Department today complaining of abdominal pain that started this morning around 6AM with associated Nausea. The pain is a 7/10 generalized cramping feeling. He tried some alka seltzer with no relief. Has had a BM this morning. Does not feel that his belly has grown in size. No dysuria or hematuria. No fevers. No emesis or diarrhea. No CP/SOB. No decrease in PO intake. No other symptoms noted.  Past Medical History  Diagnosis Date  . Acute prostatitis   . Type II or unspecified type diabetes mellitus without mention of complication, not stated as uncontrolled   . Essential hypertension, malignant   . Unspecified vitamin D deficiency   . Urethral discharge   . Anxiety    Past Surgical History  Procedure Laterality Date  . Rotator cuff repair    . Wisdom tooth extraction    . Esophagogastroduodenoscopy (egd) with propofol N/A 11/07/2014    Procedure: ESOPHAGOGASTRODUODENOSCOPY (EGD) WITH PROPOFOL;  Surgeon: Rachael Fee, MD;  Location: [facility] ENDOSCOPY;  Service: Endoscopy;  Laterality: N/A;   Family History  Problem Relation Age of Onset  . Diabetes Mother   . Hyperlipidemia Mother    Social History  Substance Use Topics  . Smoking status: Former Smoker    Quit date: 06/03/1984  . Smokeless tobacco: None  . Alcohol Use: No    Review of Systems ROS reviewed and all are negative for acute change except as noted in the HPI.  Allergies  Penicillins  Home Medications   Prior to Admission medications   Medication Sig Start Date End Date Taking? Authorizing Provider  ALPRAZolam Prudy Feeler) 0.5 MG tablet Take 0.5 mg by mouth 3 (three)  times daily.     Yes Historical Provider, MD  amLODipine (NORVASC) 5 MG tablet Take 5 mg by mouth every morning.    Yes Historical Provider, MD  aspirin 81 MG chewable tablet Chew 81 mg by mouth daily.   Yes Historical Provider, MD  esomeprazole (NEXIUM) 40 MG capsule Take 1 capsule by mouth daily. 11/04/14  Yes Historical Provider, MD  fluticasone (FLONASE) 50 MCG/ACT nasal spray Place 2 sprays into both nostrils daily. Patient taking differently: Place 2 sprays into both nostrils daily as needed for allergies.  10/18/13  Yes [name], NP  hydrOXYzine (ATARAX/VISTARIL) 10 MG tablet Take 10 mg by mouth at bedtime as needed for itching.   Yes Historical Provider, MD  ibuprofen (ADVIL,MOTRIN) 200 MG tablet Take 400 mg by mouth every 6 (six) hours as needed for moderate pain.   Yes Historical Provider, MD  meloxicam (MOBIC) 15 MG tablet Take 1 tablet by mouth daily. 12/16/14  Yes Historical Provider, MD  metoCLOPramide (REGLAN) 10 MG tablet Take 1 tablet by mouth 3 (three) times daily. 12/16/14  Yes Historical Provider, MD  metoprolol (LOPRESSOR) 50 MG tablet Take 25 mg by mouth daily.    Yes Historical Provider, MD  Saxagliptin-Metformin (KOMBIGLYZE XR) 2.12-998 MG TB24 Take 1 tablet by mouth every morning.   Yes Historical Provider, MD  selenium sulfide (SELSUN) 2.5 % shampoo Apply 1 application topically daily as needed for irritation.  12/24/14  Yes Historical Provider, MD  chlorpheniramine-HYDROcodone Stevphen Meuse PENNKINETIC  ER) 10-8 MG/5ML LQCR Take 5 mLs by mouth at bedtime as needed for cough. Patient not taking: Reported on 11/06/2014 10/18/13   [name], NP  PARoxetine (PAXIL) 20 MG tablet Take 20 mg by mouth daily. 08/27/15   Historical Provider, MD  UNABLE TO FIND This note is to excuse Mr.Byrer from work due to medical illness requiring hospitalization from 3/23 to 3/24. 11/07/14   Zannie Cove, MD   BP 142/86 mmHg  Pulse 74  Temp(Src) 98.5 F (36.9 C) (Oral)  Resp 20  SpO2 97%    Physical Exam  Constitutional: He is oriented to person, place, and time. He appears well-developed and well-nourished.  HENT:  Head: Normocephalic and atraumatic.  Eyes: EOM are normal.  Neck: Normal range of motion. Neck supple.  Cardiovascular: Normal rate, regular rhythm and normal heart sounds.   Pulmonary/Chest: Effort normal and breath sounds normal.  Abdominal: Soft. Normal appearance and bowel sounds are normal. He exhibits no distension, no ascites and no mass. There is generalized tenderness. There is no rigidity, no rebound, no guarding, no CVA tenderness, no tenderness at McBurney's point and negative Murphy's sign.  Musculoskeletal: Normal range of motion.  Neurological: He is alert and oriented to person, place, and time.  Skin: Skin is warm and dry.  Psychiatric: He has a normal mood and affect. His behavior is normal. Thought content normal.  Nursing note and vitals reviewed.  ED Course  Procedures (including critical care time) Labs Review Labs Reviewed  LIPASE, BLOOD - Abnormal; Notable for the following:    Lipase 100 (*)    All other components within normal limits  COMPREHENSIVE METABOLIC PANEL - Abnormal; Notable for the following:    Chloride 99 (*)    Glucose, Bld 221 (*)    Creatinine, Ser 1.27 (*)    AST 141 (*)    GFR calc non Af Amer 56 (*)    All other components within normal limits  CBC - Abnormal; Notable for the following:    Hemoglobin 12.6 (*)    MCH 25.9 (*)    All other components within normal limits  URINALYSIS, ROUTINE W REFLEX MICROSCOPIC (NOT AT [facility]) - Abnormal; Notable for the following:    Glucose, UA 500 (*)    All other components within normal limits  I-STAT TROPOININ, ED   Imaging Review Ct Abdomen Pelvis W Contrast  09/14/2015  CLINICAL DATA:  Generalized abdominal pain with nausea since earlier today. History hypertension and diabetes. EXAM: CT ABDOMEN AND PELVIS WITHOUT CONTRAST TECHNIQUE: Multidetector CT imaging of the  abdomen and pelvis was performed following the standard protocol without IV contrast. COMPARISON:  None. FINDINGS: Lower chest: Minor dependent bibasilar atelectasis. Normal heart size. No pericardial or pleural effusion. Small hiatal hernia noted. Lower thoracic degenerative changes with osteophytes on the right. Hepatobiliary: No mass visualized on this un-enhanced exam. Pancreas: Minimal pancreatic duct prominence. Dystrophic calcifications in the pancreatic head and uncinate process from previous calcific chronic pancreatitis. No evidence of acute pancreatitis on today's exam. Pancreatic tail in the splenic hilum, demonstrates subtle hypodense cluster like cystic area measuring 21 mm. This is confirmed on the sagittal and coronal reconstructions. Appearance is nonspecific by CT but could represent sequelae from prior pancreatitis such as residual small pseudocysts, other considerations included branch duct IMPN, or serous cystadenoma. Consider further evaluation with follow-up nonemergent MRI without and with contrast/MRCP. Spleen: Chronic dystrophic calcifications of the splenic hilum and the lateral splenic capsule, appearing benign and suspect related to prior  subcapsular splenic hemorrhage. Small prominent tortuous vessels about the spleen and also the stomach, nonspecific but can be seen with portal hypertension. Of note, the portal, splenic, mesenteric veins all appear to remain patent. Adrenals/Urinary Tract: Normal adrenal glands. Kidneys demonstrate no acute obstruction or hydronephrosis. Tiny sub cm cortical cyst in the left kidney lower pole noted. Normal symmetric renal excretion. Stomach/Bowel: Negative for bowel obstruction, dilatation, ileus, or free air. Moderate stool burden throughout the colon. Diverticulosis present of the left descending colon and sigmoid. No associated acute inflammatory process. Normal appendix demonstrated. Vascular/Lymphatic: No adenopathy. Abdominal atherosclerotic  changes of the major branch vessels, aorta and iliac bifurcation. Negative for aneurysm or dissection. No retroperitoneal hemorrhage or focal abnormality. Reproductive: No mass or other significant abnormality. Other: Right hydrocele evident with fluid tracking along the right inguinal scrotal canal. No definite associated inguinal hernia. Abdominal wall appears intact. Musculoskeletal: Lower lumbar facet arthropathy noted. No acute osseous finding. No compression fracture. IMPRESSION: No acute intra-abdominal or pelvic finding to account for the patient's symptoms. Coarse dystrophic calcifications of the pancreas head and uncinate process, compatible with sequelae from prior pancreatitis. 2 cm small cluster like cystic area in the pancreatic tail is nonspecific, as described above. See above comment and recommendation. Splenic hilar and subcapsular splenic calcifications, suspect sequelae from prior subcapsular hemorrhage. Prominent perisplenic and perigastric small tortuous vessels suggest a component of portal hypertension. Portal, splenic and mesenteric veins remain patent. Colonic diverticulosis without acute inflammatory process Incidental right hydrocele tracking along the right inguinal scrotal canal Electronically Signed   By: Judie Petit.  Shick M.D.   On: 09/14/2015 13:53   US Abdomen Limited Ruq  09/14/2015  CLINICAL DATA:  Abdominal pain since 6 a.m. EXAM: US ABDOMEN LIMITED - RIGHT UPPER QUADRANT COMPARISON:  CT scan from earlier the same day. FINDINGS: Gallbladder: Probable layering very tiny dependent gallstones. No gallbladder wall thickening or pericholecystic fluid. The sonographer reports no sonographic Murphy sign. Common bile duct: Diameter: 9 mm diameter.  Slightly distended for patient age. Liver: No focal lesion identified. Within normal limits in parenchymal echogenicity. IMPRESSION: Question tiny layering gallstones with mild extrahepatic biliary duct dilatation. Electronically Signed   By: Kennith Center M.D.   On: 09/14/2015 14:50   I have personally reviewed and evaluated these images and lab results as part of my medical decision-making.   EKG Interpretation   Date/Time:  Sunday September 14 2015 11:58:18 EST Ventricular Rate:  70 PR Interval:  174 QRS Duration: 92 QT Interval:  371 QTC Calculation: 400 R Axis:   66 Text Interpretation:  Sinus rhythm Nonspecific ST abnormality Baseline  wander in lead(s) III aVF Confirmed by Lincoln Brigham [phone]) on 09/14/2015  12:06:45 PM      MDM  I have reviewed relevant laboratory values. I have reviewed relevant imaging studies. I personally interpreted the relevant EKG. I have reviewed the relevant previous healthcare records.  I obtained HPI from historian. Patient discussed with supervising physician  ED Course:  Assessment: 41y M with hx Dm, HTN presents with generalized abdominal pain. Lipase slightly elevated at 100. AST 141 with ALT 50. Trop neg. EKG unremarkable. CT shows no acute pathology. Further examination with limited RUQ US showed mild extrahepatic biliary duct dilation concerning for an early Cholecystitis. Pt currently afebrile. VSS. Told patient that he would be a candidate for admission and observation if pain worsened. Pt declined admission and wished to go home. I gave the patient strict and explicit return precautions regarding abdominal pain and the  need for surgical intervention if symptoms worsen. The patient understood and will follow up with his PCP in 1-2 days. At discharge, patient was in NAD, VSS, Pt able to ambulate, Pt able to tolerate PO.  Disposition/Plan:  DC Home Additional Verbal discharge instructions given and discussed with patient.  Pt Instructed to f/u with PCP in the next 48 hours for evaluation and treatment of symptoms. Return precautions given Pt acknowledges and agrees with plan   Supervising Physician Tilden Fossa, MD   Final diagnoses:  Abdominal pain  RUQ abdominal pain       Audry Pili, PA-C 09/14/15 1523  Tilden Fossa, MD 09/16/15 [phone]

## 2015-09-14 NOTE — ED Notes (Signed)
RN DRAWING LABS WITH IV START 

## 2015-09-14 NOTE — ED Notes (Signed)
Pt c/o generalized abdominal pain and nausea starting this morning.  Pain score 7/10.  Denies v/d.  Pt reports "leaning forward" decreases pain.

## 2015-09-14 NOTE — ED Notes (Signed)
Pt decided that he would not stay at the suggestio of Dr Madilyn Hook. Pt vs and IV removal had been completed. When helping pt prepare for d/c, pt started to have dry heaves. Pt sts that he still wants to go home and he will come back if he gets worse despite trying to convince him to stay. Pt is A&O and in NAD

## 2016-12-04 IMAGING — CR DG ABDOMEN 1V
1 series · 1 of 1 positions shown · non-contrast
Comparison: 11/06/2014 and 11/07/2014

CLINICAL DATA: Evaluate for possible foreign body, denture
swallowed on 11/06/2014

EXAM:
ABDOMEN - 1 VIEW

[view not recorded]
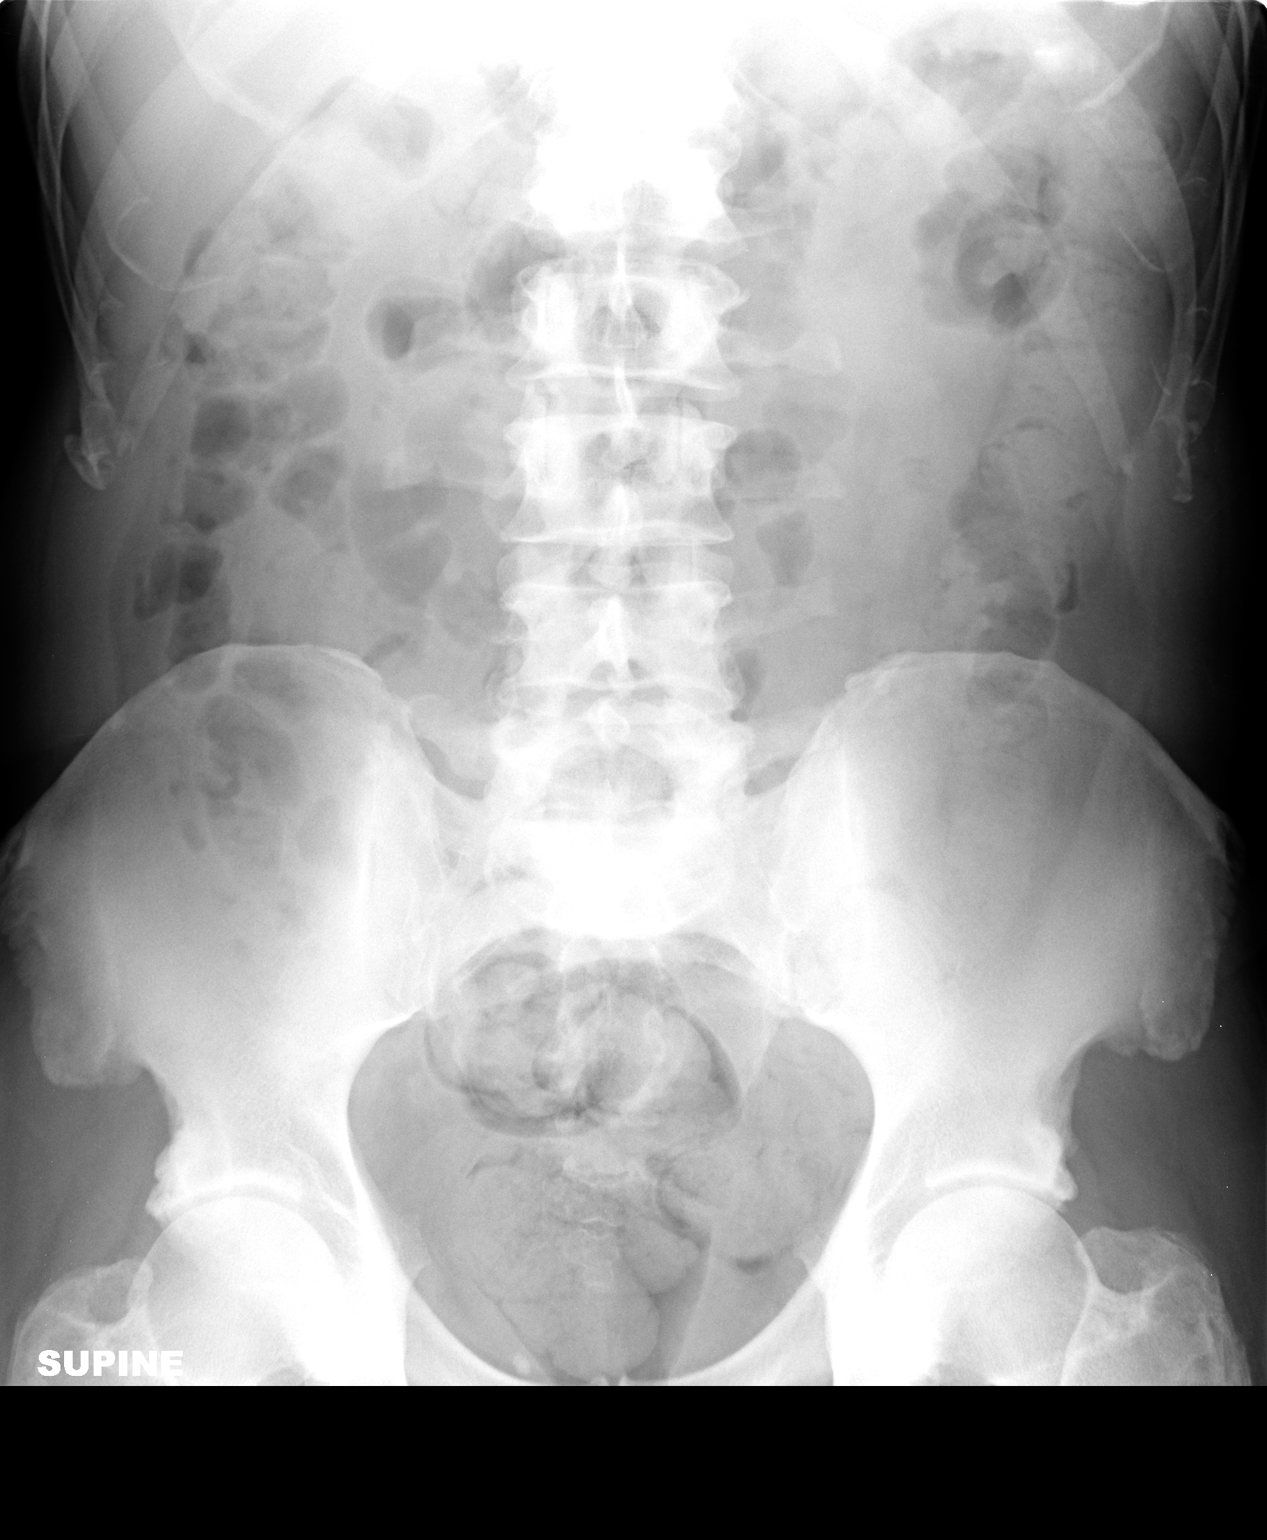

[1 of 1 positions shown; findings below may reference images not displayed]

FINDINGS: Nonobstructive bowel gas pattern. Stable phleboliths right pelvis
inferiorly. Previously identified denture fragment is no longer
seen.
IMPRESSION: Deter fragment no longer visible, presumably having been passed.

## 2019-02-10 ENCOUNTER — Other Ambulatory Visit: Payer: Self-pay

## 2019-02-10 ENCOUNTER — Emergency Department (HOSPITAL_COMMUNITY)
Admission: EM | Admit: 2019-02-10 | Discharge: 2019-02-10 | Disposition: A | Discharge status: Home / Self Care | Payer: Medicare Other | Attending: Emergency Medicine | Admitting: Emergency Medicine

## 2019-02-10 ENCOUNTER — Emergency Department (HOSPITAL_COMMUNITY): Payer: Medicare Other

## 2019-02-10 ENCOUNTER — Encounter (HOSPITAL_COMMUNITY): Payer: Self-pay

## 2019-02-10 DIAGNOSIS — Y999 Unspecified external cause status: Secondary | ICD-10-CM | POA: Diagnosis not present

## 2019-02-10 DIAGNOSIS — Z87891 Personal history of nicotine dependence: Secondary | ICD-10-CM | POA: Insufficient documentation

## 2019-02-10 DIAGNOSIS — Z79899 Other long term (current) drug therapy: Secondary | ICD-10-CM | POA: Diagnosis not present

## 2019-02-10 DIAGNOSIS — I10 Essential (primary) hypertension: Secondary | ICD-10-CM | POA: Diagnosis not present

## 2019-02-10 DIAGNOSIS — E119 Type 2 diabetes mellitus without complications: Secondary | ICD-10-CM | POA: Diagnosis not present

## 2019-02-10 DIAGNOSIS — W06XXXA Fall from bed, initial encounter: Secondary | ICD-10-CM | POA: Insufficient documentation

## 2019-02-10 DIAGNOSIS — S99922A Unspecified injury of left foot, initial encounter: Secondary | ICD-10-CM | POA: Diagnosis not present

## 2019-02-10 DIAGNOSIS — Z7984 Long term (current) use of oral hypoglycemic drugs: Secondary | ICD-10-CM | POA: Insufficient documentation

## 2019-02-10 DIAGNOSIS — Y92003 Bedroom of unspecified non-institutional (private) residence as the place of occurrence of the external cause: Secondary | ICD-10-CM | POA: Diagnosis not present

## 2019-02-10 DIAGNOSIS — Z7982 Long term (current) use of aspirin: Secondary | ICD-10-CM | POA: Insufficient documentation

## 2019-02-10 DIAGNOSIS — Y9389 Activity, other specified: Secondary | ICD-10-CM | POA: Insufficient documentation

## 2019-02-10 NOTE — ED Triage Notes (Signed)
Pt reports he fell out of bed yesterday. States pain to the left great toe. Some redness noted. Warm to touch. Patient reports he is diabetic, no open wounds noted. Ambulatory.

## 2019-02-10 NOTE — ED Provider Notes (Signed)
[facility] Provider Note   CSN: [id number] Arrival date & time: 02/10/19  7341    History   Chief Complaint Chief Complaint  Patient presents with  . Toe Injury    HPI Jonathan Schultz is a 73 y.o. male.     73 year old male presents with complaint of left great toe pain.  Patient states that he dropped his phone out of bed at approximately 7:30 PM last night, rolled over to reach down and grab his phone which caused him to fall out of bed.  Patient landed on carpeted flooring, unsure what he hit his left great toe on but has had pain in the toe with bearing weight ever since.  Patient is a diabetic, denies any skin wounds or chronic foot problems.  No other complaints or concerns.     Past Medical History:  Diagnosis Date  . Acute prostatitis   . Anxiety   . Essential hypertension, malignant   . Type II or unspecified type diabetes mellitus without mention of complication, not stated as uncontrolled   . Unspecified vitamin D deficiency   . Urethral discharge     Patient Active Problem List   Diagnosis Date Noted  . Foreign body alimentary tract   . Foreign body aspiration 11/06/2014  . Essential hypertension 11/06/2014  . Diabetes ([place]) 11/06/2014    Past Surgical History:  Procedure Laterality Date  . ESOPHAGOGASTRODUODENOSCOPY (EGD) WITH PROPOFOL N/A 11/07/2014   Procedure: ESOPHAGOGASTRODUODENOSCOPY (EGD) WITH PROPOFOL;  Surgeon: Milus Banister, MD;  Location: [facility];  Service: Endoscopy;  Laterality: N/A;  . ROTATOR CUFF REPAIR    . WISDOM TOOTH EXTRACTION          Home Medications    Prior to Admission medications   Medication Sig Start Date End Date Taking? Authorizing Provider  ALPRAZolam Duanne Moron) 0.5 MG tablet Take 0.5 mg by mouth 3 (three) times daily.      [provider]  amLODipine (NORVASC) 5 MG tablet Take 5 mg by mouth every morning.     [provider]  aspirin 81 MG chewable  tablet Chew 81 mg by mouth daily.    [provider]  chlorpheniramine-HYDROcodone (TUSSIONEX PENNKINETIC ER) 10-8 MG/5ML LQCR Take 5 mLs by mouth at bedtime as needed for cough. Patient not taking: Reported on 11/06/2014 10/18/13   Elisha Headland, FNP  esomeprazole (NEXIUM) 40 MG capsule Take 1 capsule by mouth daily. 11/04/14   [provider]  fluticasone (FLONASE) 50 MCG/ACT nasal spray Place 2 sprays into both nostrils daily. Patient taking differently: Place 2 sprays into both nostrils daily as needed for allergies.  10/18/13   Elisha Headland, FNP  hydrOXYzine (ATARAX/VISTARIL) 10 MG tablet Take 10 mg by mouth at bedtime as needed for itching.    [provider]  ibuprofen (ADVIL,MOTRIN) 200 MG tablet Take 400 mg by mouth every 6 (six) hours as needed for moderate pain.    [provider]  meloxicam (MOBIC) 15 MG tablet Take 1 tablet by mouth daily. 12/16/14   [provider]  metoCLOPramide (REGLAN) 10 MG tablet Take 1 tablet by mouth 3 (three) times daily. 12/16/14   [provider]  metoprolol (LOPRESSOR) 50 MG tablet Take 25 mg by mouth daily.     [provider]  PARoxetine (PAXIL) 20 MG tablet Take 20 mg by mouth daily. 08/27/15   [provider]  Saxagliptin-Metformin (KOMBIGLYZE XR) 2.12-998 MG TB24 Take 1 tablet by mouth every morning.  [provider]  selenium sulfide (SELSUN) 2.5 % shampoo Apply 1 application topically daily as needed for irritation.  12/24/14   [provider]  UNABLE TO FIND This note is to excuse Mr.Chalfant from work due to medical illness requiring hospitalization from 3/23 to 3/24. 11/07/14   Zannie CoveJoseph, Preetha, MD    Family History Family History  Problem Relation Age of Onset  . Diabetes Mother   . Hyperlipidemia Mother     Social History Social History   Tobacco Use  . Smoking status: Former Smoker    Quit date: 06/03/1984    Years since quitting: 34.7  Substance Use  Topics  . Alcohol use: No    Alcohol/week: 0.0 standard drinks  . Drug use: No     Allergies   Penicillins   Review of Systems Review of Systems  Constitutional: Negative for fever.  Musculoskeletal: Positive for arthralgias. Negative for joint swelling and myalgias.  Skin: Negative for color change, rash and wound.  Allergic/Immunologic: Positive for immunocompromised state.  Neurological: Negative for weakness and numbness.  Hematological: Does not bruise/bleed easily.  All other systems reviewed and are negative.    Physical Exam Updated Vital Signs BP (!) 157/70 (BP Location: Right Arm)   Pulse 79   Temp 98.3 F (36.8 C) (Oral)   Resp 20   SpO2 98%   Physical Exam Vitals signs and nursing note reviewed.  Constitutional:      General: He is not in acute distress.    Appearance: He is well-developed. He is not diaphoretic.  HENT:     Head: Normocephalic and atraumatic.  Cardiovascular:     Pulses: Normal pulses.  Pulmonary:     Effort: Pulmonary effort is normal.  Musculoskeletal:        General: Tenderness present. No swelling or deformity.     Left foot: Normal range of motion and normal capillary refill. Tenderness and bony tenderness present. No swelling, crepitus, deformity or laceration.       Feet:  Skin:    General: Skin is warm and dry.     Findings: Erythema present. No rash.  Neurological:     Mental Status: He is alert and oriented to person, place, and time.  Psychiatric:        Behavior: Behavior normal.      ED Treatments / Results  Labs (all labs ordered are listed, but only abnormal results are displayed) Labs Reviewed - No data to display  EKG None  Radiology Dg Toe Great Left  Result Date: 02/10/2019 CLINICAL DATA:  Larey SeatFell last night, pain. EXAM: LEFT GREAT TOE COMPARISON:  None. FINDINGS: No osseous fracture or dislocation seen. Adjacent soft tissues are unremarkable. IMPRESSION: Negative. Electronically Signed   By: Bary RichardStan   Maynard M.D.   On: 02/10/2019 09:20    Procedures Procedures (including critical care time)  Medications Ordered in ED Medications - No data to display   Initial Impression / Assessment and Plan / ED Course  I have reviewed the triage vital signs and the nursing notes.  Pertinent labs & imaging results that were available during my care of the patient were reviewed by me and considered in my medical decision making (see chart for details).  Clinical Course as of Feb 10 951  Sat Feb 10, 2019  [id number] 73 year old male with left great toe pain after falling out of bed yesterday.  No other injuries from the fall, pain occurs primarily with bearing weight on the foot.  On exam  patient has mild redness with tenderness at the DIP of the left great toe medially, no warmth noted.  No history of gout, area does not seem hypersensitive to the touch.  X-ray is negative for fracture.  Discussed with patient, possible sprain versus contusion, consider gout versus early infection.  Toe was buddy taped with gauze between digits, recommend recheck with PCP early next week.   [LM]    Clinical Course User Index [LM] Jeannie FendMurphy,  A, PA-C      Final Clinical Impressions(s) / ED Diagnoses   Final diagnoses:  Injury of toe on left foot, initial encounter    ED Discharge Orders    None       Jeannie FendMurphy,  A, PA-C 02/10/19 [id number]    Pricilla LovelessGoldston, Scott, MD 02/13/19 [phone]

## 2019-02-10 NOTE — Discharge Instructions (Addendum)
Buddy tape toe to the next toe over for support. Keep gauze between the toes when you do this. Recheck with your doctor next week.

## 2019-11-30 ENCOUNTER — Ambulatory Visit: Payer: Medicare Other | Attending: Internal Medicine

## 2019-11-30 DIAGNOSIS — Z23 Encounter for immunization: Secondary | ICD-10-CM

## 2019-11-30 NOTE — Progress Notes (Signed)
   Covid-19 Vaccination Clinic  Name:  Jonathan Schultz    MRN: [id number] DOB: 12-Jun-1946  11/30/2019  Jonathan Schultz was observed post Covid-19 immunization for 15 minutes without incident. He was provided with Vaccine Information Sheet and instruction to access the V-Safe system.   [facility]ultz was instructed to call 911 with any severe reactions post vaccine: Marland Kitchen Difficulty breathing  . Swelling of face and throat  . A fast heartbeat  . A bad rash all over body  . Dizziness and weakness   Immunizations Administered    Name Date Dose VIS Date Route   Pfizer COVID-19 Vaccine 11/30/2019 11:04 AM 0.3 mL 07/27/2019 Intramuscular   Manufacturer: [employer], [employer]   Lot: [id number]   NDC: [address]-0

## 2019-12-24 ENCOUNTER — Ambulatory Visit: Payer: Medicare Other | Attending: Internal Medicine

## 2019-12-24 DIAGNOSIS — Z23 Encounter for immunization: Secondary | ICD-10-CM

## 2019-12-24 NOTE — Progress Notes (Signed)
   Covid-19 Vaccination Clinic  Name:  [facility] Schultz    MRN: [id number] DOB: Dec 24, 1945  12/24/2019  [name] was observed post Covid-19 immunization for 15 minutes without incident. He was provided with Vaccine Information Sheet and instruction to access the V-Safe system.   [name] was instructed to call 911 with any severe reactions post vaccine: Marland Kitchen Difficulty breathing  . Swelling of face and throat  . A fast heartbeat  . A bad rash all over body  . Dizziness and weakness   Immunizations Administered    Name Date Dose VIS Date Route   Pfizer COVID-19 Vaccine 12/24/2019  9:20 AM 0.3 mL 10/10/2018 Intramuscular   Manufacturer: [employer], [employer]   Lot: [id number]   NDC: [address]-1

## 2020-05-26 ENCOUNTER — Encounter: Payer: Self-pay | Admitting: Registered"

## 2020-05-26 ENCOUNTER — Encounter: Payer: Medicare Other | Attending: Internal Medicine | Admitting: Registered"

## 2020-05-26 ENCOUNTER — Other Ambulatory Visit: Payer: Self-pay

## 2020-05-26 DIAGNOSIS — E1165 Type 2 diabetes mellitus with hyperglycemia: Secondary | ICD-10-CM | POA: Diagnosis present

## 2020-05-26 NOTE — Progress Notes (Signed)
Diabetes Self-Management Education  Visit Type: First/Initial  Appt. Start Time: 0800 Appt. End Time: 0910  05/26/2020  Mr. [facility], identified by name and date of birth, is a 74 y.o. male with a diagnosis of Diabetes: Type 2.   ASSESSMENT  There were no vitals taken for this visit. There is no height or weight on file to calculate BMI.   Patient states he has had diabetes for 10 yrs, this is his first visit for education. Patient states his feet are fine and does not check only when the doctor checks at his MD visits.   Patient states he works 11 pm - 7 am with 30 min break for lunch. 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   Patient states he was drinking more water when he was on [employer] because it was close to the water cooler and bathroom. Pt reports working on line #5 for 2 months, water cooler and bathroom farther way and drinking a lot less. Pt reports he tries to drink a lot of water before he goes into work. Patient drinks diet cola with meals.  Patient relies on mostly fast food for all his meals. Pt states there is a cafeteria at his work but they mostly serve pork, which he does not eat. Patient likes a variety of vegetables, reported eating at [employer] last night with mostly vegetables and then for dessert had fruit and candied yams.  Patient reports he does not eat a lot on days off because he doesn't want to fix food, mostly rests on days off. Pt states he keeps same sleep schedule on days off.  Next visit RD will provide more education on food groups, carb counting, balanced plate, and label reading. This first visit seemed important to discuss specific food choices so patient could make dietary changes soon.  Also need to provide advance directives info, RD forgot to give at this visit.   Diabetes Self-Management Education - 05/26/20 2924      Visit Information   Visit Type First/Initial      Initial Visit    Diabetes Type Type 2    Are you currently following a meal plan? No    Are you taking your medications as prescribed? Yes   Tresiba 26 units; glipizide ER   Date Diagnosed 10 yrs      Health Coping   How would you rate your overall health? Excellent      Psychosocial Assessment   Patient Belief/Attitude about Diabetes Afraid    How often do you need to have someone help you when you read instructions, pamphlets, or other written materials from your doctor or pharmacy? 1 - Never    What is the last grade level you completed in school? 12      Complications   Last HgB A1C per patient/outside source 11.5 %   02/2020   How often do you check your blood sugar? 1-2 times/day    Fasting Blood glucose range (mg/dL) 462-863    Postprandial Blood glucose range (mg/dL) >817    Have you had a dilated eye exam in the past 12 months? Yes    Have you had a dental exam in the past 12 months? No    Are you checking your feet? No      Dietary Intake   Breakfast Hamburger, small fry, diet coke OR hotdogs from cookout OR 2 roast beef sandwiches from [employer] (morning)  doritos, fritos, potato chips    Lunch Hormel dinner, Wendy's chili OR 2 fish sandwichs from [employer] oatmeal, raisin, dates, walnuts OR [place], [place], egg, sausage, combo    Beverage(s) water, diet cola      Exercise   Exercise Type ADL's    How many days per week to you exercise? 0    How many minutes per day do you exercise? 0    Total minutes per week of exercise 0      Patient Education   Previous Diabetes Education No    Nutrition management  Other (comment)   meal and snack options and looking for total carbs     Individualized Goals (developed by patient)   Nutrition Follow meal plan discussed      Outcomes   Expected Outcomes Demonstrated interest in learning. Expect positive outcomes    Future DMSE 4-6 wks    Program Status Not Completed           Individualized Plan for Diabetes  Self-Management Training:   Learning Objective:  Patient will have a greater understanding of diabetes self-management. Patient education plan is to attend individual and/or group sessions per assessed needs and concerns.    Patient Instructions  For lunch: Take steamable vegetables to go with whatever else you take for lunch at work Consider having just 1 [employer] Compleats meal instead of 2 and have your vegetables to go with it. If only having [employer] Completes and you want 2, the Beef pot roast looks like the best on for blood sugar   Subway cold cut sandwich - consider having 1/2 before work and 1/2 during your break.  Use the handout Nutrition in the Fast [place] to see how many carbohydrates are in the meals your are considering getting  For snack ideas Apples & nuts or peanut butters Celery, carrots cooked for about 3 min in microwave mandarin oranges Sugar free jello  Consider cooking old fashion oats in the microwave in a bowl 2-3x bigger than you need for eating. Add berries for sweetness.    Expected Outcomes:  Demonstrated interest in learning. Expect positive outcomes  Education material provided: Snack sheet , Nutrition in the [employer] [place]  If problems or questions, patient to contact team via:  Phone  Future DSME appointment: 4-6 wks

## 2020-05-26 NOTE — Patient Instructions (Addendum)
For lunch: Take steamable vegetables to go with whatever else you take for lunch at work Consider having just 1 [employer] Compleats meal instead of 2 and have your vegetables to go with it. If only having [employer] Completes and you want 2, the Beef pot roast looks like the best on for blood sugar   Subway cold cut sandwich - consider having 1/2 before work and 1/2 during your break.  Use the handout Nutrition in the Fast Lane to see how many carbohydrates are in the meals your are considering getting  For snack ideas Apples & nuts or peanut butters Celery, carrots cooked for about 3 min in microwave mandarin oranges Sugar free jello  Consider cooking old fashion oats in the microwave in a bowl 2-3x bigger than you need for eating. Add berries for sweetness.

## 2020-07-07 ENCOUNTER — Ambulatory Visit: Payer: Medicare Other | Admitting: Registered"

## 2020-11-11 ENCOUNTER — Other Ambulatory Visit: Payer: Self-pay | Admitting: Internal Medicine

## 2020-11-11 ENCOUNTER — Ambulatory Visit
Admission: RE | Admit: 2020-11-11 | Discharge: 2020-11-11 | Disposition: A | Discharge status: Home / Self Care | Payer: Medicare Other | Source: Ambulatory Visit | Attending: Internal Medicine | Admitting: Internal Medicine

## 2020-11-11 DIAGNOSIS — R0602 Shortness of breath: Secondary | ICD-10-CM

## 2020-11-11 DIAGNOSIS — R0789 Other chest pain: Secondary | ICD-10-CM

## 2021-08-01 ENCOUNTER — Emergency Department (HOSPITAL_COMMUNITY)
Admission: EM | Admit: 2021-08-01 | Discharge: 2021-08-01 | Disposition: A | Discharge status: Home / Self Care | Payer: Medicare Other | Attending: Emergency Medicine | Admitting: Emergency Medicine

## 2021-08-01 ENCOUNTER — Emergency Department (HOSPITAL_COMMUNITY): Payer: Medicare Other

## 2021-08-01 ENCOUNTER — Encounter (HOSPITAL_COMMUNITY): Payer: Self-pay

## 2021-08-01 ENCOUNTER — Other Ambulatory Visit: Payer: Self-pay

## 2021-08-01 DIAGNOSIS — J069 Acute upper respiratory infection, unspecified: Secondary | ICD-10-CM | POA: Insufficient documentation

## 2021-08-01 DIAGNOSIS — Z7982 Long term (current) use of aspirin: Secondary | ICD-10-CM | POA: Insufficient documentation

## 2021-08-01 DIAGNOSIS — Z20822 Contact with and (suspected) exposure to covid-19: Secondary | ICD-10-CM | POA: Diagnosis not present

## 2021-08-01 DIAGNOSIS — Z794 Long term (current) use of insulin: Secondary | ICD-10-CM | POA: Diagnosis not present

## 2021-08-01 DIAGNOSIS — I10 Essential (primary) hypertension: Secondary | ICD-10-CM | POA: Diagnosis not present

## 2021-08-01 DIAGNOSIS — E119 Type 2 diabetes mellitus without complications: Secondary | ICD-10-CM | POA: Diagnosis not present

## 2021-08-01 DIAGNOSIS — M791 Myalgia, unspecified site: Secondary | ICD-10-CM | POA: Insufficient documentation

## 2021-08-01 DIAGNOSIS — J029 Acute pharyngitis, unspecified: Secondary | ICD-10-CM | POA: Diagnosis present

## 2021-08-01 DIAGNOSIS — Z79899 Other long term (current) drug therapy: Secondary | ICD-10-CM | POA: Insufficient documentation

## 2021-08-01 DIAGNOSIS — Z87891 Personal history of nicotine dependence: Secondary | ICD-10-CM | POA: Insufficient documentation

## 2021-08-01 LAB — RESP PANEL BY RT-PCR (FLU A&B, COVID) ARPGX2
Influenza A by PCR: NEGATIVE
Influenza B by PCR: NEGATIVE
SARS Coronavirus 2 by RT PCR: NEGATIVE

## 2021-08-01 LAB — GROUP A STREP BY PCR: Group A Strep by PCR: NOT DETECTED

## 2021-08-01 MED ORDER — PHENOL 1.4 % MT LIQD: 1.0000 | OROMUCOSAL | 0 refills | Status: AC | PRN

## 2021-08-01 MED ORDER — FLUTICASONE PROPIONATE 50 MCG/ACT NA SUSP: 2.0000 | Freq: Every day | NASAL | 0 refills | Status: DC

## 2021-08-01 MED ORDER — BENZONATATE 100 MG PO CAPS: 100.0000 mg | ORAL_CAPSULE | Freq: Three times a day (TID) | ORAL | 0 refills | Status: AC

## 2021-08-01 NOTE — Discharge Instructions (Signed)
Your chest xray did not show any evidence of pneumonia.   You covid and influenza tests were negative. Your strep throat test was negative.   You likely have the common cold.   I have prescribed some medications to help with your symptoms. Please take as directed   Please follow up with your primary care provider within 5-7 days for re-evaluation of your symptoms. If you do not have a primary care provider, information for a healthcare clinic has been provided for you to make arrangements for follow up care. Please return to the emergency department for any new or worsening symptoms.

## 2021-08-01 NOTE — ED Triage Notes (Signed)
Pt c/o sore throat, body aches, and a headache since last night

## 2021-08-01 NOTE — ED Provider Notes (Signed)
[facility] DEPT Provider Note   CSN: [id number] Arrival date & time: 08/01/21  1115     History Chief Complaint  Patient presents with   Sore Throat   Generalized Body Aches   Headache    Jonathan [facility]ultz is a 75 y.o. male.  HPI   75 y/o male with a h/o acute prostatitis, anxiety, HTN, DM, who presents to the ED today for eval of URI sxs. States he started to have a sore throat, headache, body aches,congestion, rhinorrhea and a cough last night. Denies any NVD, fever, chest pain ,sweats, or chills. Denies sick contacts that he is aware of.   Past Medical History:  Diagnosis Date   Acute prostatitis    Anxiety    Essential hypertension, malignant    Type II or unspecified type diabetes mellitus without mention of complication, not stated as uncontrolled    Unspecified vitamin D deficiency    Urethral discharge     Patient Active Problem List   Diagnosis Date Noted   Uncontrolled type 2 diabetes mellitus with hyperglycemia ([place]) 05/26/2020   Foreign body alimentary tract    Foreign body aspiration 11/06/2014   Essential hypertension 11/06/2014   Diabetes ([place]) 11/06/2014    Past Surgical History:  Procedure Laterality Date   ESOPHAGOGASTRODUODENOSCOPY (EGD) WITH PROPOFOL N/A 11/07/2014   Procedure: ESOPHAGOGASTRODUODENOSCOPY (EGD) WITH PROPOFOL;  Surgeon: Milus Banister, MD;  Location: [facility];  Service: Endoscopy;  Laterality: N/A;   ROTATOR CUFF REPAIR     WISDOM TOOTH EXTRACTION         Family History  Problem Relation Age of Onset   Diabetes Mother    Hyperlipidemia Mother     Social History   Tobacco Use   Smoking status: Former    Types: Cigarettes    Quit date: 06/03/1984    Years since quitting: 37.1  Substance Use Topics   Alcohol use: No    Alcohol/week: 0.0 standard drinks   Drug use: No    Home Medications Prior to Admission medications   Medication Sig Start Date End Date Taking? Authorizing Provider   benzonatate (TESSALON) 100 MG capsule Take 1 capsule (100 mg total) by mouth every 8 (eight) hours for 5 days. 08/01/21 08/06/21 Yes ,  S, PA-C  fluticasone (FLONASE) 50 MCG/ACT nasal spray Place 2 sprays into both nostrils daily. 08/01/21  Yes ,  S, PA-C  phenol (CHLORASEPTIC) 1.4 % LIQD Use as directed 1 spray in the mouth or throat as needed for throat irritation / pain. 08/01/21  Yes ,  S, PA-C  ALPRAZolam (XANAX) 0.5 MG tablet Take 0.5 mg by mouth 3 (three) times daily.      [provider]  amLODipine (NORVASC) 5 MG tablet Take 5 mg by mouth every morning.     [provider]  aspirin 81 MG chewable tablet Chew 81 mg by mouth daily.    [provider]  chlorpheniramine-HYDROcodone (TUSSIONEX PENNKINETIC ER) 10-8 MG/5ML LQCR Take 5 mLs by mouth at bedtime as needed for cough. Patient not taking: Reported on 11/06/2014 10/18/13   Elisha Headland, FNP  esomeprazole (NEXIUM) 40 MG capsule Take 1 capsule by mouth daily. 11/04/14   [provider]  fluticasone (FLONASE) 50 MCG/ACT nasal spray Place 2 sprays into both nostrils daily. Patient taking differently: Place 2 sprays into both nostrils daily as needed for allergies.  10/18/13   Elisha Headland, FNP  glipiZIDE (GLUCOTROL) 10 MG tablet Take 10 mg by mouth daily  before breakfast.    [provider]  hydrOXYzine (ATARAX/VISTARIL) 10 MG tablet Take 10 mg by mouth at bedtime as needed for itching.    [provider]  ibuprofen (ADVIL,MOTRIN) 200 MG tablet Take 400 mg by mouth every 6 (six) hours as needed for moderate pain.    [provider]  Insulin Degludec (TRESIBA [place]) Inject 26 Units into the skin.    [provider]  magnesium oxide (MAG-OX) 400 (241.3 Mg) MG tablet Take 1 tablet by mouth 2 (two) times daily. 04/04/20   [provider]  meloxicam (MOBIC) 15 MG tablet Take 1 tablet by mouth daily. 12/16/14   [provider]   metoCLOPramide (REGLAN) 10 MG tablet Take 1 tablet by mouth 3 (three) times daily. 12/16/14   [provider]  metoprolol (LOPRESSOR) 50 MG tablet Take 25 mg by mouth daily.     [provider]  [facility] ULTRA test strip 2 (two) times daily. 02/05/20   [provider]  PARoxetine (PAXIL) 20 MG tablet Take 20 mg by mouth daily. 08/27/15   [provider]  rosuvastatin (CRESTOR) 5 MG tablet Take 5 mg by mouth at bedtime. 04/05/20   [provider]  Saxagliptin-Metformin (KOMBIGLYZE XR) 2.12-998 MG TB24 Take 1 tablet by mouth every morning. Patient not taking: Reported on 05/26/2020    [provider]  selenium sulfide (SELSUN) 2.5 % shampoo Apply 1 application topically daily as needed for irritation.  12/24/14   [provider]  UNABLE TO FIND This note is to excuse Mr.Pierotti from work due to medical illness requiring hospitalization from 3/23 to 3/24. 11/07/14   Domenic Polite, MD  Vitamin D, Ergocalciferol, (DRISDOL) 1.25 MG (50000 UNIT) CAPS capsule Take 50,000 Units by mouth once a week. 05/05/20   [provider]    Allergies    Penicillins  Review of Systems   Review of Systems  Constitutional:  Negative for chills and fever.  HENT:  Positive for congestion, rhinorrhea and sore throat. Negative for ear pain.   Eyes:  Negative for pain and visual disturbance.  Respiratory:  Positive for cough. Negative for shortness of breath.   Cardiovascular:  Negative for chest pain.  Gastrointestinal:  Negative for abdominal pain, constipation, diarrhea, nausea and vomiting.  Genitourinary:  Negative for dysuria and hematuria.  Musculoskeletal:  Positive for myalgias. Negative for back pain.  Skin:  Negative for rash.  Neurological:  Positive for headaches.  All other systems reviewed and are negative.  Physical Exam Updated Vital Signs BP (!) 141/72    Pulse 80    Temp 98.1 F (36.7 C) (Oral)    Resp 17    SpO2 99%   Physical  Exam Vitals and nursing note reviewed.  Constitutional:      General: He is not in acute distress.    Appearance: He is well-developed.  HENT:     Head: Normocephalic and atraumatic.     Mouth/Throat:     Mouth: Mucous membranes are moist.     Pharynx: No oropharyngeal exudate or posterior oropharyngeal erythema.     Tonsils: No tonsillar exudate or tonsillar abscesses. 0 on the right. 0 on the left.  Eyes:     Conjunctiva/sclera: Conjunctivae normal.  Cardiovascular:     Rate and Rhythm: Normal rate and regular rhythm.     Heart sounds: No murmur heard. Pulmonary:     Effort: Pulmonary effort is normal. No respiratory distress.     Breath sounds: Normal  breath sounds. No wheezing, rhonchi or rales.  Abdominal:     General: Bowel sounds are normal.     Palpations: Abdomen is soft.     Tenderness: There is no abdominal tenderness.  Musculoskeletal:        General: No swelling.     Cervical back: Neck supple.  Skin:    General: Skin is warm and dry.     Capillary Refill: Capillary refill takes less than 2 seconds.  Neurological:     Mental Status: He is alert.  Psychiatric:        Mood and Affect: Mood normal.    ED Results / Procedures / Treatments   Labs (all labs ordered are listed, but only abnormal results are displayed) Labs Reviewed  RESP PANEL BY RT-PCR (FLU A&B, COVID) [id number]  GROUP A STREP BY PCR    EKG None  Radiology DG Chest 2 View  Result Date: 08/01/2021 CLINICAL DATA:  Headache, body aches and sore throat since yesterday. EXAM: CHEST - 2 VIEW COMPARISON:  11/11/2020 FINDINGS: Normal heart, mediastinum and hila. Clear lungs.  No pleural effusion or pneumothorax. Skeletal structures are intact. IMPRESSION: No active cardiopulmonary disease. Electronically Signed   By: Amie Portland M.D.   On: 08/01/2021 13:40    Procedures Procedures   Medications Ordered in ED Medications - No data to display  ED Course  I have reviewed the triage vital signs  and the nursing notes.  Pertinent labs & imaging results that were available during my care of the patient were reviewed by me and considered in my medical decision making (see chart for details).    MDM Rules/Calculators/A&P                          Pt here for 1 day of uri sxs. CXR negative for acute infiltrate. Covid/flu negative. Strep test negative. Patients symptoms are consistent with URI, likely viral etiology. Discussed that antibiotics are not indicated for viral infections. Pt will be discharged with symptomatic treatment.  Verbalizes understanding and is agreeable with plan. Pt is hemodynamically stable & in NAD prior to dc.  Final Clinical Impression(s) / ED Diagnoses Final diagnoses:  Upper respiratory tract infection, unspecified type    Rx / DC Orders ED Discharge Orders          Ordered    fluticasone (FLONASE) 50 MCG/ACT nasal spray  Daily        08/01/21 1348    phenol (CHLORASEPTIC) 1.4 % LIQD  As needed        08/01/21 1348    benzonatate (TESSALON) 100 MG capsule  Every 8 hours        08/01/21 1348             ,  S, PA-C 08/01/21 1348    Wynetta Fines, MD 08/03/21 [phone]

## 2022-03-30 ENCOUNTER — Encounter (HOSPITAL_COMMUNITY): Payer: Self-pay

## 2022-03-30 ENCOUNTER — Other Ambulatory Visit: Payer: Self-pay

## 2022-03-30 ENCOUNTER — Emergency Department (HOSPITAL_COMMUNITY)
Admission: EM | Admit: 2022-03-30 | Discharge: 2022-03-30 | Disposition: A | Discharge status: Home / Self Care | Payer: Medicare HMO | Attending: Student | Admitting: Student

## 2022-03-30 DIAGNOSIS — R6883 Chills (without fever): Secondary | ICD-10-CM | POA: Diagnosis not present

## 2022-03-30 DIAGNOSIS — R0981 Nasal congestion: Secondary | ICD-10-CM | POA: Diagnosis not present

## 2022-03-30 DIAGNOSIS — Z7982 Long term (current) use of aspirin: Secondary | ICD-10-CM | POA: Insufficient documentation

## 2022-03-30 DIAGNOSIS — R531 Weakness: Secondary | ICD-10-CM | POA: Insufficient documentation

## 2022-03-30 DIAGNOSIS — Z20822 Contact with and (suspected) exposure to covid-19: Secondary | ICD-10-CM | POA: Insufficient documentation

## 2022-03-30 DIAGNOSIS — R5383 Other fatigue: Secondary | ICD-10-CM | POA: Insufficient documentation

## 2022-03-30 DIAGNOSIS — R059 Cough, unspecified: Secondary | ICD-10-CM | POA: Insufficient documentation

## 2022-03-30 DIAGNOSIS — M791 Myalgia, unspecified site: Secondary | ICD-10-CM | POA: Diagnosis not present

## 2022-03-30 LAB — RESP PANEL BY RT-PCR (FLU A&B, COVID) ARPGX2
Influenza A by PCR: NEGATIVE
Influenza B by PCR: NEGATIVE
SARS Coronavirus 2 by RT PCR: NEGATIVE

## 2022-03-30 NOTE — ED Triage Notes (Signed)
Pt reports fatigue, congestion, and runny nose over the past few days and is requesting a COVID test.

## 2022-03-30 NOTE — ED Provider Notes (Signed)
[facility] DEPT Provider Note   CSN: [id number] Arrival date & time: 03/30/22  1649     History  Chief Complaint  Patient presents with   Fatigue   Nasal Congestion    Jonathan Schultz is a 76 y.o. male  who presents to ED for evaluation of generalized fatigue. Patient complains of 3 days of fatigue, nasal congestion, dry cough, generalized weakness and body aches and chills.  Patient reports that 1.5 weeks ago he was around his niece who has [place].  The patient states that the symptoms began 3 days ago.  Patient denies fevers, nausea, vomit, diarrhea, sore throat. Patient is concerned that he has COVID-19 and is here requesting testing.   HPI     Home Medications Prior to Admission medications   Medication Sig Start Date End Date Taking? Authorizing Provider  ALPRAZolam Duanne Moron) 0.5 MG tablet Take 0.5 mg by mouth 3 (three) times daily.      [provider]  amLODipine (NORVASC) 5 MG tablet Take 5 mg by mouth every morning.     [provider]  aspirin 81 MG chewable tablet Chew 81 mg by mouth daily.    [provider]  chlorpheniramine-HYDROcodone (TUSSIONEX PENNKINETIC ER) 10-8 MG/5ML LQCR Take 5 mLs by mouth at bedtime as needed for cough. Patient not taking: Reported on 11/06/2014 10/18/13   Elisha Headland, FNP  esomeprazole (NEXIUM) 40 MG capsule Take 1 capsule by mouth daily. 11/04/14   [provider]  fluticasone (FLONASE) 50 MCG/ACT nasal spray Place 2 sprays into both nostrils daily. Patient taking differently: Place 2 sprays into both nostrils daily as needed for allergies.  10/18/13   Elisha Headland, FNP  fluticasone (FLONASE) 50 MCG/ACT nasal spray Place 2 sprays into both nostrils daily. 08/01/21   Couture, Cortni S, PA-C  glipiZIDE (GLUCOTROL) 10 MG tablet Take 10 mg by mouth daily before breakfast.    [provider]  hydrOXYzine (ATARAX/VISTARIL) 10 MG tablet Take 10 mg by mouth at bedtime as needed  for itching.    [provider]  ibuprofen (ADVIL,MOTRIN) 200 MG tablet Take 400 mg by mouth every 6 (six) hours as needed for moderate pain.    [provider]  Insulin Degludec (TRESIBA [place]) Inject 26 Units into the skin.    [provider]  magnesium oxide (MAG-OX) 400 (241.3 Mg) MG tablet Take 1 tablet by mouth 2 (two) times daily. 04/04/20   [provider]  meloxicam (MOBIC) 15 MG tablet Take 1 tablet by mouth daily. 12/16/14   [provider]  metoCLOPramide (REGLAN) 10 MG tablet Take 1 tablet by mouth 3 (three) times daily. 12/16/14   [provider]  metoprolol (LOPRESSOR) 50 MG tablet Take 25 mg by mouth daily.     [provider]  [facility] ULTRA test strip 2 (two) times daily. 02/05/20   [provider]  PARoxetine (PAXIL) 20 MG tablet Take 20 mg by mouth daily. 08/27/15   [provider]  phenol (CHLORASEPTIC) 1.4 % LIQD Use as directed 1 spray in the mouth or throat as needed for throat irritation / pain. 08/01/21   Couture, Cortni S, PA-C  rosuvastatin (CRESTOR) 5 MG tablet Take 5 mg by mouth at bedtime. 04/05/20   [provider]  Saxagliptin-Metformin (KOMBIGLYZE XR) 2.12-998 MG TB24 Take 1 tablet by mouth every morning. Patient not taking: Reported on 05/26/2020    [provider]  selenium sulfide (SELSUN) 2.5 % shampoo Apply 1 application  topically daily as needed for irritation.  12/24/14   [provider]  UNABLE TO FIND This note is to excuse Mr.Broden from work due to medical illness requiring hospitalization from 3/23 to 3/24. 11/07/14   Zannie Cove, MD  Vitamin D, Ergocalciferol, (DRISDOL) 1.25 MG (50000 UNIT) CAPS capsule Take 50,000 Units by mouth once a week. 05/05/20   [provider]      Allergies    Penicillins    Review of Systems   Review of Systems  Constitutional:  Positive for chills and fatigue. Negative for fever.  HENT:  Negative for sore throat.    Gastrointestinal:  Negative for diarrhea, nausea and vomiting.  Neurological:  Positive for weakness.    Physical Exam Updated Vital Signs BP 118/82 (BP Location: Left Arm)   Pulse 61   Temp 97.9 F (36.6 C) (Oral)   Resp 18   Ht 5\' 9"  (1.753 m)   Wt 80.7 kg   SpO2 97%   BMI 26.29 kg/m  Physical Exam Vitals and nursing note reviewed.  Constitutional:      General: He is not in acute distress.    Appearance: Normal appearance. He is not ill-appearing, toxic-appearing or diaphoretic.  HENT:     Head: Normocephalic and atraumatic.     Mouth/Throat:     Mouth: Mucous membranes are moist.     Pharynx: Oropharynx is clear. No oropharyngeal exudate or posterior oropharyngeal erythema.  Eyes:     Extraocular Movements: Extraocular movements intact.     Conjunctiva/sclera: Conjunctivae normal.     Pupils: Pupils are equal, round, and reactive to light.  Cardiovascular:     Rate and Rhythm: Normal rate and regular rhythm.  Pulmonary:     Effort: Pulmonary effort is normal.     Breath sounds: Normal breath sounds. No wheezing.  Abdominal:     General: Abdomen is flat. Bowel sounds are normal.     Palpations: Abdomen is soft.     Tenderness: There is no abdominal tenderness.  Musculoskeletal:     Cervical back: Normal range of motion and neck supple. No tenderness.  Skin:    General: Skin is warm and dry.     Capillary Refill: Capillary refill takes less than 2 seconds.  Neurological:     Mental Status: He is alert and oriented to person, place, and time.     GCS: GCS eye subscore is 4. GCS verbal subscore is 5. GCS motor subscore is 6.     Cranial Nerves: Cranial nerves 2-12 are intact. No cranial nerve deficit.     Sensory: Sensation is intact. No sensory deficit.     Motor: Motor function is intact. No weakness.     Coordination: Coordination is intact. Heel to [facility] Test normal.     ED Results / Procedures / Treatments   Labs (all labs ordered are listed, but only  abnormal results are displayed) Labs Reviewed  RESP PANEL BY RT-PCR (FLU A&B, COVID) [id number]    EKG None  Radiology No results found.  Procedures Procedures   Medications Ordered in ED Medications - No data to display  ED Course/ Medical Decision Making/ A&P                           Medical Decision Making  76 year old male presents for viral testing.  Please see HPI for further details.  On examination the patient is afebrile and nontachycardic.  Patient lung sounds clear  bilaterally, he is not hypoxic on room air.  The patient abdomen is soft and compressible.  Patient posterior oropharynx has no signs of exudate, erythema.  Patient neurological examination shows no focal neurodeficits.  Patient nontoxic in appearance.  Patient viral testing is negative for influenza, COVID.  Patient advised to continue treating symptoms at home.  Patient advised to return to ED for any new symptoms.  Patient advised to follow-up with his PCP some point this week.  Patient voices understanding of instructions.  Patient had all of his questions answered to satisfaction prior to discharge.  Patient stable at this time for discharge home.   Final Clinical Impression(s) / ED Diagnoses Final diagnoses:  Other fatigue    Rx / DC Orders ED Discharge Orders     None         Clent Ridges 03/30/22 1915    K[place], [place], MD 03/31/22 [phone]

## 2022-03-30 NOTE — Discharge Instructions (Addendum)
Please return to the ED with any new symptoms such as one-sided weakness or numbness Please follow-up with your PCP Please continue treating your symptoms at home utilizing ibuprofen or Tylenol.  You may also utilize electrolyte supplementation beverages. Please read attached guide concerning fatigue

## 2022-03-30 NOTE — ED Provider Triage Note (Signed)
Emergency Medicine Provider Triage Evaluation Note  Jonathan Schultz , a 76 y.o. male  was evaluated in triage.  Pt complains of 3 days of fatigue, nasal congestion, dry cough, generalized weakness and body aches and chills.  Patient reports that 1.5 weeks ago he was around his niece who has [place].  The patient states that the symptoms began 3 days ago.  Patient denies fevers, nausea, vomit, diarrhea, sore throat.  Review of Systems  Positive:  Negative:   Physical Exam  BP 111/64 (BP Location: Left Arm)   Pulse 62   Temp 98 F (36.7 C) (Oral)   Resp 16   Ht 5\' 9"  (1.753 m)   Wt 80.7 kg   SpO2 98%   BMI 26.29 kg/m  Gen:   Awake, no distress   Resp:  Normal effort  MSK:   Moves extremities without difficulty  Other:  Lung sounds clear  Medical Decision Making  Medically screening exam initiated at 5:44 PM.  Appropriate orders placed.  Jonathan Schultz was informed that the remainder of the evaluation will be completed by another provider, this initial triage assessment does not replace that evaluation, and the importance of remaining in the ED until their evaluation is complete.     Loraine Leriche, PA-C 03/30/22 1744

## 2022-05-06 ENCOUNTER — Ambulatory Visit
Admission: RE | Admit: 2022-05-06 | Discharge: 2022-05-06 | Disposition: A | Discharge status: Home / Self Care | Payer: Medicare HMO | Source: Ambulatory Visit | Attending: Internal Medicine | Admitting: Internal Medicine

## 2022-05-06 ENCOUNTER — Other Ambulatory Visit: Payer: Self-pay | Admitting: Internal Medicine

## 2022-05-06 DIAGNOSIS — M5440 Lumbago with sciatica, unspecified side: Secondary | ICD-10-CM

## 2022-05-06 DIAGNOSIS — W19XXXA Unspecified fall, initial encounter: Secondary | ICD-10-CM

## 2022-08-05 ENCOUNTER — Ambulatory Visit
Admission: RE | Admit: 2022-08-05 | Discharge: 2022-08-05 | Disposition: A | Discharge status: Home / Self Care | Payer: Medicare HMO | Source: Ambulatory Visit | Attending: Internal Medicine | Admitting: Internal Medicine

## 2022-08-05 ENCOUNTER — Other Ambulatory Visit: Payer: Self-pay | Admitting: Internal Medicine

## 2022-08-05 DIAGNOSIS — R058 Other specified cough: Secondary | ICD-10-CM

## 2023-04-11 ENCOUNTER — Other Ambulatory Visit: Payer: Self-pay

## 2023-04-11 ENCOUNTER — Emergency Department (HOSPITAL_COMMUNITY): Payer: Medicare HMO

## 2023-04-11 ENCOUNTER — Emergency Department (HOSPITAL_COMMUNITY)
Admission: EM | Admit: 2023-04-11 | Discharge: 2023-04-12 | Disposition: A | Discharge status: Home / Self Care | Payer: Medicare HMO | Attending: Emergency Medicine | Admitting: Emergency Medicine

## 2023-04-11 ENCOUNTER — Encounter (HOSPITAL_COMMUNITY): Payer: Self-pay | Admitting: Emergency Medicine

## 2023-04-11 DIAGNOSIS — E041 Nontoxic single thyroid nodule: Secondary | ICD-10-CM | POA: Insufficient documentation

## 2023-04-11 DIAGNOSIS — R0789 Other chest pain: Secondary | ICD-10-CM | POA: Insufficient documentation

## 2023-04-11 DIAGNOSIS — N289 Disorder of kidney and ureter, unspecified: Secondary | ICD-10-CM | POA: Insufficient documentation

## 2023-04-11 DIAGNOSIS — Z79899 Other long term (current) drug therapy: Secondary | ICD-10-CM | POA: Diagnosis not present

## 2023-04-11 DIAGNOSIS — R519 Headache, unspecified: Secondary | ICD-10-CM | POA: Insufficient documentation

## 2023-04-11 DIAGNOSIS — Z794 Long term (current) use of insulin: Secondary | ICD-10-CM | POA: Insufficient documentation

## 2023-04-11 DIAGNOSIS — E1165 Type 2 diabetes mellitus with hyperglycemia: Secondary | ICD-10-CM | POA: Insufficient documentation

## 2023-04-11 DIAGNOSIS — I10 Essential (primary) hypertension: Secondary | ICD-10-CM | POA: Insufficient documentation

## 2023-04-11 DIAGNOSIS — Z7984 Long term (current) use of oral hypoglycemic drugs: Secondary | ICD-10-CM | POA: Insufficient documentation

## 2023-04-11 DIAGNOSIS — W07XXXA Fall from chair, initial encounter: Secondary | ICD-10-CM | POA: Insufficient documentation

## 2023-04-11 DIAGNOSIS — W19XXXA Unspecified fall, initial encounter: Secondary | ICD-10-CM

## 2023-04-11 LAB — CBC WITH DIFFERENTIAL/PLATELET
Abs Immature Granulocytes: 0.01 10*3/uL (ref 0.00–0.07)
Basophils Absolute: 0 10*3/uL (ref 0.0–0.1)
Basophils Relative: 0 %
Eosinophils Absolute: 0.1 10*3/uL (ref 0.0–0.5)
Eosinophils Relative: 2 %
HCT: 35 % — ABNORMAL LOW (ref 39.0–52.0)
Hemoglobin: 11.2 g/dL — ABNORMAL LOW (ref 13.0–17.0)
Immature Granulocytes: 0 %
Lymphocytes Relative: 34 %
Lymphs Abs: 1.9 10*3/uL (ref 0.7–4.0)
MCH: 25.7 pg — ABNORMAL LOW (ref 26.0–34.0)
MCHC: 32 g/dL (ref 30.0–36.0)
MCV: 80.5 fL (ref 80.0–100.0)
Monocytes Absolute: 0.4 10*3/uL (ref 0.1–1.0)
Monocytes Relative: 6 %
Neutro Abs: 3.2 10*3/uL (ref 1.7–7.7)
Neutrophils Relative %: 58 %
Platelets: 248 10*3/uL (ref 150–400)
RBC: 4.35 MIL/uL (ref 4.22–5.81)
RDW: 14.3 % (ref 11.5–15.5)
WBC: 5.6 10*3/uL (ref 4.0–10.5)
nRBC: 0 % (ref 0.0–0.2)

## 2023-04-11 LAB — COMPREHENSIVE METABOLIC PANEL
ALT: 14 U/L (ref 0–44)
AST: 22 U/L (ref 15–41)
Albumin: 3.3 g/dL — ABNORMAL LOW (ref 3.5–5.0)
Alkaline Phosphatase: 64 U/L (ref 38–126)
Anion gap: 12 (ref 5–15)
BUN: 18 mg/dL (ref 8–23)
CO2: 19 mmol/L — ABNORMAL LOW (ref 22–32)
Calcium: 8.6 mg/dL — ABNORMAL LOW (ref 8.9–10.3)
Chloride: 105 mmol/L (ref 98–111)
Creatinine, Ser: 1.87 mg/dL — ABNORMAL HIGH (ref 0.61–1.24)
GFR, Estimated: 37 mL/min — ABNORMAL LOW (ref >60)
Glucose, Bld: 337 mg/dL — ABNORMAL HIGH (ref 70–99)
Potassium: 4.5 mmol/L (ref 3.5–5.1)
Sodium: 136 mmol/L (ref 135–145)
Total Bilirubin: 0.4 mg/dL (ref 0.3–1.2)
Total Protein: 5.9 g/dL — ABNORMAL LOW (ref 6.5–8.1)

## 2023-04-11 LAB — CBG MONITORING, ED: Glucose-Capillary: 266 mg/dL — ABNORMAL HIGH (ref 70–99)

## 2023-04-11 MED ORDER — SODIUM CHLORIDE 0.9 % IV BOLUS
1000.0000 mL | Freq: Once | INTRAVENOUS | Status: AC
  Administered 2023-04-11: 1000 mL via INTRAVENOUS

## 2023-04-11 NOTE — ED Provider Notes (Signed)
[place] EMERGENCY DEPARTMENT AT [facility] Provider Note   CSN: [id number] Arrival date & time: 04/11/23  2117     History {Add pertinent medical, surgical, social history, OB history to HPI:1} Chief Complaint  Patient presents with   Jonathan Schultz is a 77 y.o. male.  Patient with past medical history significant for uncontrolled type II DM, malignant hypertension, anxiety presents to the emergency department complaining of a fall.  He states that over the past year he is at increased falls.  He states if he leans forward to pick something up off the floor or bends over to tie his shoes he feels like he is losing balance and fall.  When sitting upright or standing/walking he has no difficulties.  He only has issues when bending over.  Tonight he was bending over in his chair to sets on the table and fell forward, hitting his chest on a table and his head on the floor.  He denies loss of consciousness, denies blood thinner usage.  He does have an abrasion to the right side of the scalp with bleeding controlled.  Patient currently denies chest pain other than soreness from the table, denies shortness of breath, abdominal pain, nausea, vomiting, weakness, sensation deficits, confusion, fever.  HPI     Home Medications Prior to Admission medications   Medication Sig Start Date End Date Taking? Authorizing Provider  ALPRAZolam Prudy Feeler) 0.5 MG tablet Take 0.5 mg by mouth 3 (three) times daily.      [provider]  amLODipine (NORVASC) 5 MG tablet Take 5 mg by mouth every morning.     [provider]  aspirin 81 MG chewable tablet Chew 81 mg by mouth daily.    [provider]  chlorpheniramine-HYDROcodone (TUSSIONEX PENNKINETIC ER) 10-8 MG/5ML LQCR Take 5 mLs by mouth at bedtime as needed for cough. Patient not taking: Reported on 11/06/2014 10/18/13   [name], FNP  esomeprazole (NEXIUM) 40 MG capsule Take 1 capsule by mouth daily. 11/04/14    [provider]  fluticasone (FLONASE) 50 MCG/ACT nasal spray Place 2 sprays into both nostrils daily. Patient taking differently: Place 2 sprays into both nostrils daily as needed for allergies.  10/18/13   [name], FNP  fluticasone (FLONASE) 50 MCG/ACT nasal spray Place 2 sprays into both nostrils daily. 08/01/21   Couture, Cortni S, PA-C  glipiZIDE (GLUCOTROL) 10 MG tablet Take 10 mg by mouth daily before breakfast.    [provider]  hydrOXYzine (ATARAX/VISTARIL) 10 MG tablet Take 10 mg by mouth at bedtime as needed for itching.    [provider]  ibuprofen (ADVIL,MOTRIN) 200 MG tablet Take 400 mg by mouth every 6 (six) hours as needed for moderate pain.    [provider]  Insulin Degludec (TRESIBA [place]) Inject 26 Units into the skin.    [provider]  magnesium oxide (MAG-OX) 400 (241.3 Mg) MG tablet Take 1 tablet by mouth 2 (two) times daily. 04/04/20   [provider]  meloxicam (MOBIC) 15 MG tablet Take 1 tablet by mouth daily. 12/16/14   [provider]  metoCLOPramide (REGLAN) 10 MG tablet Take 1 tablet by mouth 3 (three) times daily. 12/16/14   [provider]  metoprolol (LOPRESSOR) 50 MG tablet Take 25 mg by mouth daily.     [provider]  [facility] ULTRA test strip 2 (two) times daily. 02/05/20   [provider]  PARoxetine (PAXIL) 20 MG tablet  Take 20 mg by mouth daily. 08/27/15   [provider]  phenol (CHLORASEPTIC) 1.4 % LIQD Use as directed 1 spray in the mouth or throat as needed for throat irritation / pain. 08/01/21   Couture, Cortni S, PA-C  rosuvastatin (CRESTOR) 5 MG tablet Take 5 mg by mouth at bedtime. 04/05/20   [provider]  Saxagliptin-Metformin (KOMBIGLYZE XR) 2.12-998 MG TB24 Take 1 tablet by mouth every morning. Patient not taking: Reported on 05/26/2020    [provider]  selenium sulfide (SELSUN) 2.5 % shampoo Apply 1 application topically  daily as needed for irritation.  12/24/14   [provider]  UNABLE TO FIND This note is to excuse Mr.Seifer from work due to medical illness requiring hospitalization from 3/23 to 3/24. 11/07/14   Zannie Cove, MD  Vitamin D, Ergocalciferol, (DRISDOL) 1.25 MG (50000 UNIT) CAPS capsule Take 50,000 Units by mouth once a week. 05/05/20   [provider]      Allergies    Penicillins    Review of Systems   Review of Systems  Physical Exam Updated Vital Signs BP 121/64   Pulse 63   Temp 98.4 F (36.9 C) (Oral)   Resp 11   Ht 5\' 9"  (1.753 m)   Wt 80.7 kg   SpO2 97%   BMI 26.29 kg/m  Physical Exam Vitals and nursing note reviewed.  Constitutional:      General: He is not in acute distress.    Appearance: He is well-developed.  HENT:     Head: Normocephalic and atraumatic.     Mouth/Throat:     Mouth: Mucous membranes are moist.  Eyes:     Extraocular Movements: Extraocular movements intact.     Conjunctiva/sclera: Conjunctivae normal.     Pupils: Pupils are equal, round, and reactive to light.  Cardiovascular:     Rate and Rhythm: Normal rate and regular rhythm.     Heart sounds: No murmur heard. Pulmonary:     Effort: Pulmonary effort is normal. No respiratory distress.     Breath sounds: Normal breath sounds.  Abdominal:     Palpations: Abdomen is soft.     Tenderness: There is no abdominal tenderness.  Musculoskeletal:        General: Tenderness (Point tenderness to right side of chest, just lateral to sternum) present. No swelling.     Cervical back: Neck supple.  Skin:    General: Skin is warm and dry.     Capillary Refill: Capillary refill takes less than 2 seconds.  Neurological:     Mental Status: He is alert and oriented to person, place, and time.     Cranial Nerves: No cranial nerve deficit (CN II-VII, XI, XII intact).  Psychiatric:        Mood and Affect: Mood normal.     ED Results / Procedures / Treatments   Labs (all labs ordered  are listed, but only abnormal results are displayed) Labs Reviewed  URINALYSIS, ROUTINE W REFLEX MICROSCOPIC  COMPREHENSIVE METABOLIC PANEL  CBC WITH DIFFERENTIAL/PLATELET  CBG MONITORING, ED    EKG None  Radiology No results found.  Procedures Procedures  {Document cardiac monitor, telemetry assessment procedure when appropriate:1}  Medications Ordered in ED Medications - No data to display  ED Course/ Medical Decision Making/ A&P   {   Click here for ABCD2, HEART and other calculatorsREFRESH Note before signing :1}  Medical Decision Making Amount and/or Complexity of Data Reviewed Labs: ordered. Radiology: ordered.   This patient presents to the ED for concern of injury from fall, this involves an extensive number of treatment options, and is a complaint that carries with it a high risk of complications and morbidity.  The differential diagnosis includes fracture, soft tissue injury, intracranial abnormality with potential causes including dysrhythmia, dehydration, metabolic abnormality, others   Co morbidities that complicate the patient evaluation  Diabetes   Additional history obtained:  Additional history obtained from EMS   Lab Tests:  I Ordered, and personally interpreted labs.  The pertinent results include: Glucose 266   Imaging Studies ordered:  I ordered imaging studies including CT head and cervical spine without contrast, right sided rib series I independently visualized and interpreted imaging which showed  1. No acute intracranial process.  2. Multilevel degenerative changes in the cervical spine without  evidence of acute fracture.  3. 2.3 cm nodule in the right lobe of the thyroid gland. Nonemergent  ultrasound is suggested for further evaluation.   1. No evidence of active pulmonary disease.  2. Negative right ribs.   I agree with the radiologist interpretation   Cardiac Monitoring: / EKG:  The patient  was maintained on a cardiac monitor.  I personally viewed and interpreted the cardiac monitored which showed an underlying rhythm of: ***   Consultations Obtained:  I requested consultation with the ***,  and discussed lab and imaging findings as well as pertinent plan - they recommend: ***   Problem List / ED Course / Critical interventions / Medication management  *** I ordered medication including ***  for ***  Reevaluation of the patient after these medicines showed that the patient {resolved/improved/worsened:23923::"improved"} I have reviewed the patients home medicines and have made adjustments as needed   Social Determinants of Health:  ***   Test / Admission - Considered:  ***   {Document critical care time when appropriate:1} {Document review of labs and clinical decision tools ie heart score, Chads2Vasc2 etc:1}  {Document your independent review of radiology images, and any outside records:1} {Document your discussion with family members, caretakers, and with consultants:1} {Document social determinants of health affecting pt's care:1} {Document your decision making why or why not admission, treatments were needed:1} Final Clinical Impression(s) / ED Diagnoses Final diagnoses:  None    Rx / DC Orders ED Discharge Orders     None

## 2023-04-11 NOTE — ED Notes (Signed)
Patient transported to CT 

## 2023-04-11 NOTE — ED Triage Notes (Addendum)
Pt BIB [facility] from home. Pt was sitting in chair and leaned forward to sit something on the table and was not able to stop once he began to fall. Pt denies LOC and states he has had several falls in recently. Not on blood thinners. Pt does have abrasion to the right side of head, bleeding controled.

## 2023-04-12 LAB — URINALYSIS, ROUTINE W REFLEX MICROSCOPIC
Bacteria, UA: NONE SEEN
Bilirubin Urine: NEGATIVE
Glucose, UA: >=500 mg/dL — AB
Hgb urine dipstick: NEGATIVE
Ketones, ur: NEGATIVE mg/dL
Leukocytes,Ua: NEGATIVE
Nitrite: NEGATIVE
Protein, ur: NEGATIVE mg/dL
Specific Gravity, Urine: 1.026 (ref 1.005–1.030)
pH: 5 (ref 5.0–8.0)

## 2023-04-12 NOTE — Discharge Instructions (Addendum)
You were evaluated tonight after a fall.  Your workup was reassuring.  Please follow-up with your primary care provider in 1 week for repeat labs.  You should also discuss with your primary care provider a nodule which was seen on your thyroid gland.  You may need nonemergent outpatient ultrasound for further evaluation.  You develop any life-threatening symptoms please return to the emergency department.

## 2023-04-14 ENCOUNTER — Emergency Department (HOSPITAL_COMMUNITY): Payer: Medicare HMO

## 2023-04-14 ENCOUNTER — Encounter (HOSPITAL_COMMUNITY): Payer: Self-pay | Admitting: Emergency Medicine

## 2023-04-14 ENCOUNTER — Other Ambulatory Visit: Payer: Self-pay

## 2023-04-14 ENCOUNTER — Emergency Department (HOSPITAL_COMMUNITY)
Admission: EM | Admit: 2023-04-14 | Discharge: 2023-04-15 | Disposition: A | Discharge status: Home / Self Care | Payer: Medicare HMO | Attending: Emergency Medicine | Admitting: Emergency Medicine

## 2023-04-14 DIAGNOSIS — Z7984 Long term (current) use of oral hypoglycemic drugs: Secondary | ICD-10-CM | POA: Insufficient documentation

## 2023-04-14 DIAGNOSIS — E041 Nontoxic single thyroid nodule: Secondary | ICD-10-CM | POA: Diagnosis not present

## 2023-04-14 DIAGNOSIS — Z7982 Long term (current) use of aspirin: Secondary | ICD-10-CM | POA: Diagnosis not present

## 2023-04-14 DIAGNOSIS — Z794 Long term (current) use of insulin: Secondary | ICD-10-CM | POA: Insufficient documentation

## 2023-04-14 DIAGNOSIS — I10 Essential (primary) hypertension: Secondary | ICD-10-CM | POA: Diagnosis not present

## 2023-04-14 DIAGNOSIS — E119 Type 2 diabetes mellitus without complications: Secondary | ICD-10-CM | POA: Insufficient documentation

## 2023-04-14 DIAGNOSIS — Z79899 Other long term (current) drug therapy: Secondary | ICD-10-CM | POA: Insufficient documentation

## 2023-04-14 DIAGNOSIS — R0789 Other chest pain: Secondary | ICD-10-CM | POA: Insufficient documentation

## 2023-04-14 LAB — CBC
HCT: 38.1 % — ABNORMAL LOW (ref 39.0–52.0)
Hemoglobin: 12 g/dL — ABNORMAL LOW (ref 13.0–17.0)
MCH: 25 pg — ABNORMAL LOW (ref 26.0–34.0)
MCHC: 31.5 g/dL (ref 30.0–36.0)
MCV: 79.4 fL — ABNORMAL LOW (ref 80.0–100.0)
Platelets: 231 10*3/uL (ref 150–400)
RBC: 4.8 MIL/uL (ref 4.22–5.81)
RDW: 14.1 % (ref 11.5–15.5)
WBC: 4.5 10*3/uL (ref 4.0–10.5)
nRBC: 0 % (ref 0.0–0.2)

## 2023-04-14 LAB — BASIC METABOLIC PANEL
Anion gap: 16 — ABNORMAL HIGH (ref 5–15)
BUN: 13 mg/dL (ref 8–23)
CO2: 20 mmol/L — ABNORMAL LOW (ref 22–32)
Calcium: 9.1 mg/dL (ref 8.9–10.3)
Chloride: 102 mmol/L (ref 98–111)
Creatinine, Ser: 1.48 mg/dL — ABNORMAL HIGH (ref 0.61–1.24)
GFR, Estimated: 49 mL/min — ABNORMAL LOW (ref >60)
Glucose, Bld: 240 mg/dL — ABNORMAL HIGH (ref 70–99)
Potassium: 4.5 mmol/L (ref 3.5–5.1)
Sodium: 138 mmol/L (ref 135–145)

## 2023-04-14 LAB — TROPONIN I (HIGH SENSITIVITY): Troponin I (High Sensitivity): 5 ng/L (ref <18)

## 2023-04-14 NOTE — ED Triage Notes (Signed)
Pt had a fall on Monday and struck his chest.  He states he was seen and told he was okay. Today has been having coughing and felt that something "popped"  Pain has continued since then and  is severe.

## 2023-04-15 ENCOUNTER — Emergency Department (HOSPITAL_COMMUNITY): Payer: Medicare HMO

## 2023-04-15 LAB — TROPONIN I (HIGH SENSITIVITY): Troponin I (High Sensitivity): 6 ng/L (ref <18)

## 2023-04-15 MED ORDER — OXYCODONE-ACETAMINOPHEN 5-325 MG PO TABS: 1.0000 | ORAL_TABLET | Freq: Four times a day (QID) | ORAL | 0 refills | Status: AC | PRN

## 2023-04-15 MED ORDER — OXYCODONE-ACETAMINOPHEN 5-325 MG PO TABS
1.0000 | ORAL_TABLET | Freq: Once | ORAL | Status: AC
  Administered 2023-04-15: 1 via ORAL
  Filled 2023-04-15: qty 1

## 2023-04-15 NOTE — Discharge Instructions (Addendum)
Were seen today for chest wall pain.  Your CT scan does not show any obvious fracture.  You likely have a chest wall contusion from your recent fall.  You will be given a short course of pain medication.  Do not drive while taking pain medication.  Do not take additional Tylenol.  Your CT also noted a thyroid nodule.  It is important that you follow-up with your primary physician to have an ultrasound.

## 2023-04-15 NOTE — ED Provider Notes (Signed)
[facility] AT [facility] Provider Note   CSN: [id number] Arrival date & time: 04/14/23  2153     History  Chief Complaint  Patient presents with   Chest Pain   Fall    Jonathan Schultz is a 77 y.o. male.  HPI     This is a 77 year old male who presents with worsening right-sided chest pain.  Patient reports that he fell on Monday.  He was seen and evaluated I did not have any side rib fractures.  Patient states that he went home and he forcefully cough.  He felt something "pop."  Reports excruciating pain.  He has been taking Tylenol with some relief.  No significant shortness of breath.  Pain is worse with breathing.  Home Medications Prior to Admission medications   Medication Sig Start Date End Date Taking? Authorizing Provider  oxyCODONE-acetaminophen (PERCOCET/ROXICET) 5-325 MG tablet Take 1 tablet by mouth every 6 (six) hours as needed for severe pain. 04/15/23  Yes Quayshaun Hubbert, Mayer Masker, MD  ALPRAZolam Prudy Feeler) 0.5 MG tablet Take 0.5 mg by mouth 3 (three) times daily.      [provider]  amLODipine (NORVASC) 5 MG tablet Take 5 mg by mouth every morning.     [provider]  aspirin 81 MG chewable tablet Chew 81 mg by mouth daily.    [provider]  esomeprazole (NEXIUM) 40 MG capsule Take 1 capsule by mouth daily. 11/04/14   [provider]  fluticasone (FLONASE) 50 MCG/ACT nasal spray Place 2 sprays into both nostrils daily. Patient taking differently: Place 2 sprays into both nostrils daily as needed for allergies.  10/18/13   [name], FNP  fluticasone (FLONASE) 50 MCG/ACT nasal spray Place 2 sprays into both nostrils daily. 08/01/21   Couture, Cortni S, PA-C  glipiZIDE (GLUCOTROL) 10 MG tablet Take 10 mg by mouth daily before breakfast.    [provider]  hydrOXYzine (ATARAX/VISTARIL) 10 MG tablet Take 10 mg by mouth at bedtime as needed for itching.    [provider]  Insulin  Degludec (TRESIBA [place]) Inject 26 Units into the skin.    [provider]  magnesium oxide (MAG-OX) 400 (241.3 Mg) MG tablet Take 1 tablet by mouth 2 (two) times daily. 04/04/20   [provider]  meloxicam (MOBIC) 15 MG tablet Take 1 tablet by mouth daily. 12/16/14   [provider]  metoCLOPramide (REGLAN) 10 MG tablet Take 1 tablet by mouth 3 (three) times daily. 12/16/14   [provider]  metoprolol (LOPRESSOR) 50 MG tablet Take 25 mg by mouth daily.     [provider]  [facility] ULTRA test strip 2 (two) times daily. 02/05/20   [provider]  PARoxetine (PAXIL) 20 MG tablet Take 20 mg by mouth daily. 08/27/15   [provider]  phenol (CHLORASEPTIC) 1.4 % LIQD Use as directed 1 spray in the mouth or throat as needed for throat irritation / pain. 08/01/21   Couture, Cortni S, PA-C  rosuvastatin (CRESTOR) 5 MG tablet Take 5 mg by mouth at bedtime. 04/05/20   [provider]  selenium sulfide (SELSUN) 2.5 % shampoo Apply 1 application topically daily as needed for irritation.  12/24/14   [provider]  UNABLE TO FIND This note is to excuse Mr.Levett from work due to medical illness requiring hospitalization from 3/23 to 3/24. 11/07/14   Zannie Cove, MD  Vitamin D, Ergocalciferol, (DRISDOL) 1.25 MG (50000 UNIT) CAPS capsule Take  50,000 Units by mouth once a week. 05/05/20   [provider]      Allergies    Penicillins    Review of Systems   Review of Systems  Constitutional:  Negative for fever.  Respiratory:  Positive for cough. Negative for shortness of breath.   Cardiovascular:  Positive for chest pain.  All other systems reviewed and are negative.   Physical Exam Updated Vital Signs BP (!) 144/72   Pulse (!) 58   Temp 97.9 F (36.6 C) (Oral)   Resp 18   SpO2 100%  Physical Exam Vitals and nursing note reviewed.  Constitutional:      Appearance: He is well-developed. He is not ill-appearing.   HENT:     Head: Normocephalic and atraumatic.  Eyes:     Pupils: Pupils are equal, round, and reactive to light.  Cardiovascular:     Rate and Rhythm: Normal rate and regular rhythm.     Heart sounds: Normal heart sounds. No murmur heard. Pulmonary:     Effort: Pulmonary effort is normal. No respiratory distress.     Breath sounds: Normal breath sounds. No wheezing.  Chest:     Chest wall: Tenderness present.     Comments: Tenderness to palpation right chest wall without crepitus Abdominal:     General: Bowel sounds are normal.     Palpations: Abdomen is soft.     Tenderness: There is no abdominal tenderness. There is no rebound.  Musculoskeletal:     Cervical back: Neck supple.  Lymphadenopathy:     Cervical: No cervical adenopathy.  Skin:    General: Skin is warm and dry.  Neurological:     General: No focal deficit present.     Mental Status: He is alert and oriented to person, place, and time.     ED Results / Procedures / Treatments   Labs (all labs ordered are listed, but only abnormal results are displayed) Labs Reviewed  BASIC METABOLIC PANEL - Abnormal; Notable for the following components:      Result Value   CO2 20 (*)    Glucose, Bld 240 (*)    Creatinine, Ser 1.48 (*)    GFR, Estimated 49 (*)    Anion gap 16 (*)    All other components within normal limits  CBC - Abnormal; Notable for the following components:   Hemoglobin 12.0 (*)    HCT 38.1 (*)    MCV 79.4 (*)    MCH 25.0 (*)    All other components within normal limits  TROPONIN I (HIGH SENSITIVITY)  TROPONIN I (HIGH SENSITIVITY)    EKG None  Radiology CT Chest Wo Contrast  Result Date: 04/15/2023 CLINICAL DATA:  Status post fall on Monday striking chest. Patient reports cough and feeling like something popped. Pain continues and is severe. EXAM: CT CHEST WITHOUT CONTRAST TECHNIQUE: Multidetector CT imaging of the chest was performed following the standard protocol without IV contrast.  RADIATION DOSE REDUCTION: This exam was performed according to the departmental dose-optimization program which includes automated exposure control, adjustment of the mA and/or kV according to patient size and/or use of iterative reconstruction technique. COMPARISON:  Radiographs of the right ribs and chest 04/11/2023 FINDINGS: Cardiovascular: The heart size appears within normal limits. Aortic atherosclerosis. No pericardial effusion. Mediastinum/Nodes: Enlarged and heterogeneous right lobe of thyroid gland. This has mass effect upon the right lateral wall of the trachea with lateral deviation of the trachea. Esophagus appears within normal limits. No enlarged mediastinal  or axillary lymph nodes. Hilar lymph nodes are suboptimally visualized due to lack of IV contrast. Lungs/Pleura: No pleural fluid, consolidative change, or pneumothorax. Scarring within the posterior left lower lobe. Right upper lobe there is a cluster of tree-in-bud nodularity, image 71/4. Cluster of tree-in-bud nodularity also identified within the posterolateral left upper lobe, image 98/4. Upper Abdomen: No acute abnormality. Small hiatal hernia. Parenchymal and intraductal calcifications within the head of pancreas identified. No main duct dilatation or mass identified. Subcapsular calcifications overlying the spleen are again seen and are favored to represent sequelae of remote trauma. Musculoskeletal: No rib fractures identified. No acute or suspicious osseous findings. No chest wall mass or hematoma. IMPRESSION: 1. No acute traumatic findings within the chest. 2. Clusters of tree-in-bud nodularity within the right upper lobe and left upper lobe are favored to represent sequelae of infectious bronchiolitis. 3. Enlarged and heterogeneous right lobe of thyroid gland. This has mass effect upon the right lateral wall of the trachea with lateral deviation of the trachea. Recommend thyroid ultrasound (ref: J Am Coll Radiol. 2015 Feb;12(2):  143-50). 4. Parenchymal and intraductal calcifications within the head of pancreas are identified. No main duct dilatation or mass identified. Findings are favored to represent sequelae of chronic pancreatitis. 5. Small hiatal hernia. 6.  Aortic Atherosclerosis (ICD10-I70.0). Electronically Signed   By: Signa Kell M.D.   On: 04/15/2023 06:41   DG Chest 2 View  Result Date: 04/14/2023 CLINICAL DATA:  Chest pain, fall on Monday with injury to chest. Cough. EXAM: CHEST - 2 VIEW COMPARISON:  04/11/2023. FINDINGS: The heart size and mediastinal contours are within normal limits. No consolidation, effusion, or pneumothorax. Degenerative changes are present in the thoracic spine. IMPRESSION: No active cardiopulmonary disease. Electronically Signed   By: Thornell Sartorius M.D.   On: 04/14/2023 22:38    Procedures Procedures    Medications Ordered in ED Medications  oxyCODONE-acetaminophen (PERCOCET/ROXICET) 5-325 MG per tablet 1 tablet (1 tablet Oral Given 04/15/23 8119)    ED Course/ Medical Decision Making/ A&P                                 Medical Decision Making Amount and/or Complexity of Data Reviewed Labs: ordered. Radiology: ordered.  Risk Prescription drug management.   This patient presents to the ED for concern of chest pain, this involves an extensive number of treatment options, and is a complaint that carries with it a high risk of complications and morbidity.  I considered the following differential and admission for this acute, potentially life threatening condition.  The differential diagnosis includes ACS, PE, pneumothorax, pneumonia, chest wall pain.  MDM:    This is a 77 year old male who presents with persistent right-sided chest wall pain.  Experienced a fall on Monday.  Had negative imaging at that time.  He is not on any blood thinners.  Reports persistent right sided pain.  He is not hypoxic.  He satting 100% on room air.  Breath sounds are clear.  He does have  reproducible tenderness on exam.  Labs obtained and reviewed.  Troponin negative.  Basic lab work reassuring.  EKG is nonischemic.  Chest x-ray does not show any pneumothorax or pneumonia.  Given persistence of symptoms, CT scan was obtained.  CT scan does not show any occult fracture.  Questionable bronchiolitis although patient has clear breath sounds and is not having any infectious symptoms.  He also has a thyroid that  is enlarged.  I advised patient of this finding and need for ultrasound.  Will treat for chest wall pain or contusion.  (Labs, imaging, consults)  Labs: I Ordered, and personally interpreted labs.  The pertinent results include: CBC, BMP, troponin x 2  Imaging Studies ordered: I ordered imaging studies including chest x-ray, CT I independently visualized and interpreted imaging. I agree with the radiologist interpretation  Additional history obtained from chart review.  External records from outside source obtained and reviewed including prior evaluations  Cardiac Monitoring: The patient was maintained on a cardiac monitor.  If on the cardiac monitor, I personally viewed and interpreted the cardiac monitored which showed an underlying rhythm of: Sinus rhythm  Reevaluation: After the interventions noted above, I reevaluated the patient and found that they have :improved  Social Determinants of Health:  lives independently  Disposition: Discharge  Co morbidities that complicate the patient evaluation  Past Medical History:  Diagnosis Date   Acute prostatitis    Anxiety    Essential hypertension, malignant    Type II or unspecified type diabetes mellitus without mention of complication, not stated as uncontrolled    Unspecified vitamin D deficiency    Urethral discharge      Medicines Meds ordered this encounter  Medications   oxyCODONE-acetaminophen (PERCOCET/ROXICET) 5-325 MG per tablet 1 tablet   oxyCODONE-acetaminophen (PERCOCET/ROXICET) 5-325 MG tablet     Sig: Take 1 tablet by mouth every 6 (six) hours as needed for severe pain.    Dispense:  10 tablet    Refill:  0    I have reviewed the patients home medicines and have made adjustments as needed  Problem List / ED Course: Problem List Items Addressed This Visit   None Visit Diagnoses     Chest wall pain    -  Primary   Thyroid nodule                       Final Clinical Impression(s) / ED Diagnoses Final diagnoses:  Chest wall pain  Thyroid nodule    Rx / DC Orders ED Discharge Orders          Ordered    oxyCODONE-acetaminophen (PERCOCET/ROXICET) 5-325 MG tablet  Every 6 hours PRN        04/15/23 0646              Shon Baton, MD 04/15/23 [phone]

## 2023-05-31 ENCOUNTER — Ambulatory Visit: Payer: Medicare HMO | Attending: Cardiology | Admitting: Cardiology

## 2023-05-31 ENCOUNTER — Encounter: Payer: Self-pay | Admitting: Cardiology

## 2023-05-31 VITALS — BP 132/60 | HR 64 | Ht 69.0 in | Wt 170.6 lb

## 2023-05-31 DIAGNOSIS — E1165 Type 2 diabetes mellitus with hyperglycemia: Secondary | ICD-10-CM

## 2023-05-31 DIAGNOSIS — I10 Essential (primary) hypertension: Secondary | ICD-10-CM | POA: Diagnosis not present

## 2023-05-31 DIAGNOSIS — E782 Mixed hyperlipidemia: Secondary | ICD-10-CM

## 2023-05-31 NOTE — Patient Instructions (Signed)
Medication Instructions:  Your physician recommends that you continue on your current medications as directed. Please refer to the Current Medication list given to you today.  *If you need a refill on your cardiac medications before your next appointment, please call your pharmacy*  Lab Work: Your physician recommends that you have lab work- lipid panel  If you have labs (blood work) drawn today and your tests are completely normal, you will receive your results only by: MyChart Message (if you have MyChart) OR A paper copy in the mail If you have any lab test that is abnormal or we need to change your treatment, we will call you to review the results.  Testing/Procedures: None ordered today.  Follow-Up: At [facility], you and your health needs are our priority.  As part of our continuing mission to provide you with exceptional heart care, we have created designated Provider Care Teams.  These Care Teams include your primary Cardiologist (physician) and Advanced Practice Providers (APPs -  Physician Assistants and Nurse Practitioners) who all work together to provide you with the care you need, when you need it.  We recommend signing up for the patient portal called "MyChart".  Sign up information is provided on this After Visit Summary.  MyChart is used to connect with patients for Virtual Visits (Telemedicine).  Patients are able to view lab/test results, encounter notes, upcoming appointments, etc.  Non-urgent messages can be sent to your provider as well.   To learn more about what you can do with MyChart, go to [email or web address].    Your next appointment:   As needed  Provider:   Elder Negus, MD     Other Instructions  Diet & Lifestyle recommendations:  Physical activity recommendation (The Physical Activity Guidelines for Americans. JAMA 2018;Nov 12) At least 150-300 minutes a week of moderate-intensity, or 75-150 minutes a week of vigorous-intensity  aerobic physical activity, or an equivalent combination of moderate- and vigorous-intensity aerobic activity. Adults should perform muscle-strengthening activities on 2 or more days a week. Older adults should do multicomponent physical activity that includes balance training as well as aerobic and muscle-strengthening activities. Benefits of increased physical activity include lower risk of mortality including cardiovascular mortality, lower risk of cardiovascular events and associated risk factors (hypertension and diabetes), and lower risk of many cancers (including bladder, breast, colon, endometrium, esophagus, kidney, lung, and stomach). Additional improvments have been seen in cognition, risk of dementia, anxiety and depression, improved bone health, lower risk of falls, and associated injuries.  Dietary recommendation The 2019 ACC/AHA guidelines promote nutrition as a main fixture of cardiovascular wellness, with a recommendation for a varied diet of fruit, vegetables, fish, legumes, and whole grains (Class I), as well as recommendations to reduce sodium, cholesterol, processed meats, and refined sugars (Class IIa recommendation).10 Sodium intake, a topic of some controversy as of late, is recommended to be kept at 1,500 mg/day or less, far below the average daily intake in the [place] of 3,409 mg/day, and notably below that of previous [place] recommendations for 300mg /day.10,11 For those unable to reach 1,500 mg/day, they recommend at least a reduction of 1000 mg/day.  A Pesco-Mediterranean Diet With Intermittent Fasting: JACC Review Topic of the Week. J Am Coll Cardiol 2020;76:1484-1493 Pesco-Mediterranean diet, it is supplemented with extra-virgin olive oil (EVOO), which is the principle fat source, along with moderate amounts of dairy (particularly yogurt and cheese) and eggs, as well as modest amounts of alcohol consumption (ideally red wine with the evening meal), but  few red and processed meats.

## 2023-05-31 NOTE — Progress Notes (Signed)
Cardiology Office Note:  .   Date:  05/31/2023  ID:  Jonathan Schultz, DOB 02/10/1946, MRN [id number] PCP: Gwenyth Bender, MD  [facility] Providers Cardiologist:  Truett Mainland, MD PCP: Gwenyth Bender, MD  Chief Complaint  Patient presents with   Chest Pain      History of Present Illness: Jonathan Schultz is a 77 y.o. male with hypertension, hyperlipidemia, type 2 DM  Patient lives alone and are independent in their activities of daily living, including driving and grocery shopping. He reports poor control of their diabetes, with a recent HbA1c of 11 despite dietary modifications and daily insulin. He has frequent follow-ups with their primary care provider, Dr. August Saucer, who has adjusted their insulin regimen multiple times. They have not seen an endocrinologist. They have a history of smoking, but quit in 1985. He denies any chest pain, shortness of breath, or leg swelling. Patient has had recent increase in falls, often when bending over. He reports neck pain with certain neck movements.  Patient was seen in emergency room on 04/15/2023 with fall, where chest pain is also noted, but patient denies any sort of chest pain at this time.   Vitals:   05/31/23 1313  BP: 132/60  Pulse: 64  SpO2: 96%     ROS:  Review of Systems  Cardiovascular:  Negative for chest pain, dyspnea on exertion, leg swelling, palpitations and syncope.  Musculoskeletal:  Positive for falls.     Studies Reviewed: Marland Kitchen       EKG 04/15/2023: Sinus rhythm Normal EKG  CTA chest 8/30//2024: 1. No acute traumatic findings within the chest. 2. Clusters of tree-in-bud nodularity within the right upper lobe and left upper lobe are favored to represent sequelae of infectious bronchiolitis. 3. Enlarged and heterogeneous right lobe of thyroid gland. This has mass effect upon the right lateral wall of the trachea with lateral deviation of the trachea. Recommend thyroid ultrasound (ref: J Am Coll Radiol.  2015 Feb;12(2): 143-50). 4. Parenchymal and intraductal calcifications within the head of pancreas are identified. No main duct dilatation or mass identified. Findings are favored to represent sequelae of chronic pancreatitis. 5. Small hiatal hernia. 6.  Aortic Atherosclerosis (ICD10-I70.0).    Labs 04/14/2023: Glucose 240, BUN/Cr 13/1.48. EGFR 49. Na/K 138/4.5. Albumin 3.3. Protein 5.9. Rest of the CMP normal H/H 12/38. MCV 79. Platelets 231    Physical Exam:   Physical Exam Vitals and nursing note reviewed.  Constitutional:      General: He is not in acute distress. Neck:     Vascular: No JVD.  Cardiovascular:     Rate and Rhythm: Normal rate and regular rhythm.     Heart sounds: Normal heart sounds. No murmur heard. Pulmonary:     Effort: Pulmonary effort is normal.     Breath sounds: Normal breath sounds. No wheezing or rales.  Musculoskeletal:     Right lower leg: No edema.     Left lower leg: No edema.      VISIT DIAGNOSES:   ICD-10-CM   1. Essential hypertension  I10 Lipid panel    2. Mixed hyperlipidemia  E78.2 Lipid panel    3. Uncontrolled type 2 diabetes mellitus with hyperglycemia (HCC)  E11.65        ASSESSMENT AND PLAN: .    Jonathan Schultz is a 77 y.o. male with hypertension, hyperlipidemia, type 2 DM  Poorly controlled diabetic patient with risk factors for CAD.  However, on  detailed questioning, he denies any chest pain at this time.  In absence of symptoms, I do not recommend any further cardiac testing.  Continue aspirin, statin, current blood pressure lowering therapy.  I will check lipid panel for risk stratification.  Recommend regular follow-up with PCP and monitoring of blood sugar.  F/u as needed  Signed, Jonathan Negus, MD

## 2023-06-01 ENCOUNTER — Telehealth: Payer: Self-pay

## 2023-06-01 LAB — LIPID PANEL
Chol/HDL Ratio: 3.5 {ratio} (ref 0.0–5.0)
Cholesterol, Total: 150 mg/dL (ref 100–199)
HDL: 43 mg/dL (ref >39)
LDL Chol Calc (NIH): 83 mg/dL (ref 0–99)
Triglycerides: 136 mg/dL (ref 0–149)
VLDL Cholesterol Cal: 24 mg/dL (ref 5–40)

## 2023-06-01 NOTE — Telephone Encounter (Signed)
Call to discuss results, no answer. Left message with no identifiers asking recipient to call our office number.

## 2023-06-01 NOTE — Telephone Encounter (Signed)
-----   Message from [facility] sent at 06/01/2023  7:44 AM EDT ----- Cholesterol is good but could be even better. He is on the lowest dose of Crestor 5 mg daily. Recommend increasing it to 10 mg daily. Please send prescription for 10 mg daily 90 pills. In future, this could be refilled by PCP.  Thanks MJP

## 2023-06-01 NOTE — Progress Notes (Signed)
Cholesterol is good but could be even better. He is on the lowest dose of Crestor 5 mg daily. Recommend increasing it to 10 mg daily. Please send prescription for 10 mg daily 90 pills. In future, this could be refilled by PCP.  Thanks MJP

## 2023-06-09 ENCOUNTER — Encounter: Payer: Self-pay | Admitting: *Deleted

## 2023-06-14 ENCOUNTER — Telehealth: Payer: Self-pay | Admitting: *Deleted

## 2023-06-14 MED ORDER — ROSUVASTATIN CALCIUM 10 MG PO TABS: 10.0000 mg | ORAL_TABLET | Freq: Every day | ORAL | 0 refills | Status: AC

## 2023-06-14 NOTE — Telephone Encounter (Signed)
-----   Message from [facility] sent at 06/01/2023  7:44 AM EDT ----- Cholesterol is good but could be even better. He is on the lowest dose of Crestor 5 mg daily. Recommend increasing it to 10 mg daily. Please send prescription for 10 mg daily 90 pills. In future, this could be refilled by PCP.  Thanks MJP

## 2023-06-14 NOTE — Telephone Encounter (Signed)
The patient has been notified of the result and verbalized understanding.  All questions (if any) were answered.  Pt aware to increase his crestor to 10 mg po daily.  Confirmed the pharmacy of choice with the pt.   Pt is aware that further refills of this medication can be managed and sent by his PCP Dr. August Saucer, if he'd like.   Pt request that I send these results and plan to his PCP Dr. August Saucer to have.   He okayed me sending in the increased dose of crestor 10 mg po daily to his pharmacy, and said he will have his PCP further manage his lipids/refills thereafter, once refills exhaust.   Pt verbalized understanding and agrees with this plan.  Pt was gracious for all the assistance provided.

## 2024-01-27 ENCOUNTER — Other Ambulatory Visit: Payer: Self-pay | Admitting: Internal Medicine

## 2024-01-27 DIAGNOSIS — M542 Cervicalgia: Secondary | ICD-10-CM

## 2024-02-06 ENCOUNTER — Ambulatory Visit
Admission: RE | Admit: 2024-02-06 | Discharge: 2024-02-06 | Disposition: A | Discharge status: Home / Self Care | Source: Ambulatory Visit | Attending: Internal Medicine | Admitting: Internal Medicine

## 2024-02-06 DIAGNOSIS — M542 Cervicalgia: Secondary | ICD-10-CM

## 2024-04-10 ENCOUNTER — Other Ambulatory Visit: Payer: Self-pay | Admitting: Internal Medicine

## 2024-04-10 DIAGNOSIS — E041 Nontoxic single thyroid nodule: Secondary | ICD-10-CM

## 2024-04-19 ENCOUNTER — Ambulatory Visit
Admission: RE | Admit: 2024-04-19 | Discharge: 2024-04-19 | Disposition: A | Discharge status: Home / Self Care | Source: Ambulatory Visit | Attending: Internal Medicine | Admitting: Internal Medicine

## 2024-04-19 DIAGNOSIS — E041 Nontoxic single thyroid nodule: Secondary | ICD-10-CM

## 2024-05-09 ENCOUNTER — Emergency Department (HOSPITAL_COMMUNITY): Admission: EM | Admit: 2024-05-09 | Discharge: 2024-05-09 | Disposition: A | Discharge status: Home / Self Care

## 2024-05-09 ENCOUNTER — Other Ambulatory Visit: Payer: Self-pay

## 2024-05-09 ENCOUNTER — Emergency Department (HOSPITAL_COMMUNITY)

## 2024-05-09 ENCOUNTER — Encounter (HOSPITAL_COMMUNITY): Payer: Self-pay

## 2024-05-09 DIAGNOSIS — Z7982 Long term (current) use of aspirin: Secondary | ICD-10-CM | POA: Diagnosis not present

## 2024-05-09 DIAGNOSIS — R109 Unspecified abdominal pain: Secondary | ICD-10-CM | POA: Diagnosis not present

## 2024-05-09 DIAGNOSIS — K5730 Diverticulosis of large intestine without perforation or abscess without bleeding: Secondary | ICD-10-CM | POA: Diagnosis not present

## 2024-05-09 DIAGNOSIS — Z79899 Other long term (current) drug therapy: Secondary | ICD-10-CM | POA: Diagnosis not present

## 2024-05-09 DIAGNOSIS — I10 Essential (primary) hypertension: Secondary | ICD-10-CM | POA: Insufficient documentation

## 2024-05-09 DIAGNOSIS — K8689 Other specified diseases of pancreas: Secondary | ICD-10-CM | POA: Diagnosis not present

## 2024-05-09 DIAGNOSIS — K8020 Calculus of gallbladder without cholecystitis without obstruction: Secondary | ICD-10-CM | POA: Diagnosis not present

## 2024-05-09 DIAGNOSIS — R1013 Epigastric pain: Secondary | ICD-10-CM | POA: Diagnosis not present

## 2024-05-09 LAB — URINALYSIS, ROUTINE W REFLEX MICROSCOPIC
Bilirubin Urine: NEGATIVE
Glucose, UA: NEGATIVE mg/dL
Hgb urine dipstick: NEGATIVE
Ketones, ur: NEGATIVE mg/dL
Leukocytes,Ua: NEGATIVE
Nitrite: NEGATIVE
Protein, ur: NEGATIVE mg/dL
Specific Gravity, Urine: 1.013 (ref 1.005–1.030)
pH: 6 (ref 5.0–8.0)

## 2024-05-09 LAB — COMPREHENSIVE METABOLIC PANEL WITH GFR
ALT: 13 U/L (ref 0–44)
AST: 23 U/L (ref 15–41)
Albumin: 4 g/dL (ref 3.5–5.0)
Alkaline Phosphatase: 64 U/L (ref 38–126)
Anion gap: 10 (ref 5–15)
BUN: 13 mg/dL (ref 8–23)
CO2: 25 mmol/L (ref 22–32)
Calcium: 9.4 mg/dL (ref 8.9–10.3)
Chloride: 106 mmol/L (ref 98–111)
Creatinine, Ser: 1.25 mg/dL — ABNORMAL HIGH (ref 0.61–1.24)
GFR, Estimated: 59 mL/min — ABNORMAL LOW (ref >60)
Glucose, Bld: 82 mg/dL (ref 70–99)
Potassium: 3.9 mmol/L (ref 3.5–5.1)
Sodium: 141 mmol/L (ref 135–145)
Total Bilirubin: 0.4 mg/dL (ref 0.0–1.2)
Total Protein: 7.4 g/dL (ref 6.5–8.1)

## 2024-05-09 LAB — CBC
HCT: 40 % (ref 39.0–52.0)
Hemoglobin: 12.5 g/dL — ABNORMAL LOW (ref 13.0–17.0)
MCH: 25.3 pg — ABNORMAL LOW (ref 26.0–34.0)
MCHC: 31.3 g/dL (ref 30.0–36.0)
MCV: 81 fL (ref 80.0–100.0)
Platelets: 271 K/uL (ref 150–400)
RBC: 4.94 MIL/uL (ref 4.22–5.81)
RDW: 13.9 % (ref 11.5–15.5)
WBC: 6.7 K/uL (ref 4.0–10.5)
nRBC: 0 % (ref 0.0–0.2)

## 2024-05-09 LAB — LIPASE, BLOOD: Lipase: 15 U/L (ref 11–51)

## 2024-05-09 MED ORDER — ALUM & MAG HYDROXIDE-SIMETH 200-200-20 MG/5ML PO SUSP
30.0000 mL | Freq: Once | ORAL | Status: AC
  Administered 2024-05-09: 30 mL via ORAL
  Filled 2024-05-09: qty 30

## 2024-05-09 MED ORDER — IOHEXOL 350 MG/ML SOLN
75.0000 mL | Freq: Once | INTRAVENOUS | Status: AC | PRN
  Administered 2024-05-09: 75 mL via INTRAVENOUS

## 2024-05-09 MED ORDER — OMEPRAZOLE 20 MG PO CPDR: 20.0000 mg | DELAYED_RELEASE_CAPSULE | Freq: Every day | ORAL | 0 refills | Status: AC

## 2024-05-09 MED ORDER — FENTANYL CITRATE PF 50 MCG/ML IJ SOSY
50.0000 ug | PREFILLED_SYRINGE | Freq: Once | INTRAMUSCULAR | Status: DC
Start: 2024-05-09 — End: 2024-05-09
  Filled 2024-05-09: qty 1

## 2024-05-09 NOTE — ED Provider Notes (Signed)
 [place] [facility] AT [facility] Provider Note   CSN: [id number] Arrival date & time: 05/09/24  1208     Patient presents with: Abdominal Pain and Flank Pain   Jonathan Schultz is a 78 y.o. male.   78 year old male with a past medical history of hypertension, anxiety presents to the ED with a chief complaint of right flank pain for about 1 week.  Patient reports the pain is intermittent, feels sharp and stabbing to the epigastric region.  Sometimes the pain is exacerbated with oral intake.  He has been taking some Tylenol  without much improvement in his symptoms.  There is no alleviating factors.  He denies any urinary symptoms.  He has not had any nausea, vomiting, fevers.  Pain does not associate with chest pain, or shortness of breath.  The history is provided by the patient.  Abdominal Pain Pain location:  R flank Pain quality: pressure   Pain radiates to:  Epigastric region Pain severity:  Moderate Onset quality:  Sudden Duration:  1 week Timing:  Constant Associated symptoms: no chest pain, no chills, no fever, no nausea, no shortness of breath and no vomiting   Flank Pain Associated symptoms include abdominal pain. Pertinent negatives include no chest pain and no shortness of breath.       Prior to Admission medications   Medication Sig Start Date End Date Taking? Authorizing Provider  omeprazole  (PRILOSEC) 20 MG capsule Take 1 capsule (20 mg total) by mouth daily for 14 days. 05/09/24 05/23/24 Yes Brigida Scotti, PA-C  ALPRAZolam (XANAX) 1 MG tablet Take 1 mg by mouth 3 (three) times daily as needed. 04/28/23   [provider]  amLODipine (NORVASC) 5 MG tablet Take 5 mg by mouth every morning.     [provider]  aspirin 81 MG chewable tablet Chew 81 mg by mouth daily.    [provider]  citalopram (CELEXA) 20 MG tablet Take 20 mg by mouth daily. 05/15/23   [provider]  fluticasone  (FLONASE ) 50 MCG/ACT nasal  spray Place 2 sprays into both nostrils daily. Patient taking differently: Place 2 sprays into both nostrils daily as needed for allergies. 10/18/13   Vannie Sor, FNP  glipiZIDE (GLUCOTROL) 10 MG tablet Take 10 mg by mouth daily before breakfast.    [provider]  hydrOXYzine (ATARAX/VISTARIL) 10 MG tablet Take 10 mg by mouth at bedtime as needed for itching.    [provider]  magnesium oxide (MAG-OX) 400 (241.3 Mg) MG tablet Take 1 tablet by mouth 2 (two) times daily. Patient not taking: Reported on 05/31/2023 04/04/20   [provider]  meloxicam (MOBIC) 15 MG tablet Take 1 tablet by mouth daily. 12/16/14   [provider]  metoCLOPramide  (REGLAN ) 10 MG tablet Take 1 tablet by mouth 3 (three) times daily. 12/16/14   [provider]  metoprolol  (LOPRESSOR ) 50 MG tablet Take 25 mg by mouth daily.     [provider]  midodrine (PROAMATINE) 5 MG tablet Take 5 mg by mouth 2 (two) times daily. 04/28/23   [provider]  [facility] ULTRA test strip 2 (two) times daily. 02/05/20   [provider]  oxyCODONE -acetaminophen  (PERCOCET/ROXICET) 5-325 MG tablet Take 1 tablet by mouth every 6 (six) hours as needed for severe pain. 04/15/23   Horton, Charmaine FALCON, MD  PARoxetine (PAXIL) 20 MG tablet Take 20 mg by mouth daily. 08/27/15   [provider]  phenol (CHLORASEPTIC) 1.4 % LIQD Use as  directed 1 spray in the mouth or throat as needed for throat irritation / pain. 08/01/21   Couture, Cortni S, PA-C  rosuvastatin  (CRESTOR ) 10 MG tablet Take 1 tablet (10 mg total) by mouth daily. 06/14/23   Patwardhan, Newman PARAS, MD  tamsulosin (FLOMAX) 0.4 MG CAPS capsule Take 0.4 mg by mouth daily. 05/30/23   [provider]  TRESIBA FLEXTOUCH 200 UNIT/ML FlexTouch Pen SMARTSIG:30 Unit(s) SUB-Q Daily 05/07/23   [provider]  UNABLE TO FIND This note is to excuse Mr.Nero from work due to medical illness requiring hospitalization  from 3/23 to 3/24. 11/07/14   Joseph, Preetha, MD  Vitamin D, Ergocalciferol, (DRISDOL) 1.25 MG (50000 UNIT) CAPS capsule Take 50,000 Units by mouth once a week. 05/05/20   [provider]    Allergies: Penicillins    Review of Systems  Constitutional:  Negative for chills and fever.  Respiratory:  Negative for shortness of breath.   Cardiovascular:  Negative for chest pain.  Gastrointestinal:  Positive for abdominal pain. Negative for nausea and vomiting.  Genitourinary:  Positive for flank pain.  Musculoskeletal:  Negative for back pain.  All other systems reviewed and are negative.   Updated Vital Signs BP (!) 145/66   Pulse (!) 55   Temp 97.7 F (36.5 C) (Oral)   Resp 19   Ht 5' 9 (1.753 m)   Wt 77.1 kg   SpO2 100%   BMI 25.10 kg/m   Physical Exam Vitals and nursing note reviewed.  Constitutional:      Appearance: He is well-developed.  HENT:     Head: Normocephalic and atraumatic.  Eyes:     General: No scleral icterus.    Pupils: Pupils are equal, round, and reactive to light.  Cardiovascular:     Heart sounds: Normal heart sounds.  Pulmonary:     Effort: Pulmonary effort is normal.     Breath sounds: Normal breath sounds. No wheezing.  Chest:     Chest wall: No tenderness.  Abdominal:     General: Bowel sounds are normal. There is no distension.     Palpations: Abdomen is soft.     Tenderness: There is no abdominal tenderness. There is right CVA tenderness and left CVA tenderness.  Musculoskeletal:        General: No tenderness or deformity.     Cervical back: Normal range of motion.  Skin:    General: Skin is warm and dry.  Neurological:     Mental Status: He is alert and oriented to person, place, and time.     (all labs ordered are listed, but only abnormal results are displayed) Labs Reviewed  COMPREHENSIVE METABOLIC PANEL WITH GFR - Abnormal; Notable for the following components:      Result Value   Creatinine, Ser 1.25 (*)    GFR,  Estimated 59 (*)    All other components within normal limits  CBC - Abnormal; Notable for the following components:   Hemoglobin 12.5 (*)    MCH 25.3 (*)    All other components within normal limits  URINALYSIS, ROUTINE W REFLEX MICROSCOPIC - Abnormal; Notable for the following components:   Color, Urine STRAW (*)    All other components within normal limits  LIPASE, BLOOD    EKG: None  Radiology: CT ABDOMEN PELVIS W CONTRAST Result Date: 05/09/2024 CLINICAL DATA:  Acute nonlocalized abdominal pain.  Flank pain. EXAM: CT ABDOMEN AND PELVIS WITH CONTRAST TECHNIQUE: Multidetector CT imaging of the abdomen and pelvis  was performed using the standard protocol following bolus administration of intravenous contrast. RADIATION DOSE REDUCTION: This exam was performed according to the departmental dose-optimization program which includes automated exposure control, adjustment of the mA and/or kV according to patient size and/or use of iterative reconstruction technique. CONTRAST:  75mL OMNIPAQUE  IOHEXOL  350 MG/ML SOLN COMPARISON:  Abdominal ultrasound 09/14/2015. CT abdomen pelvis 09/14/2015. FINDINGS: Lower chest: Lung bases are clear. Hepatobiliary: No focal liver lesions. Small stones layering in the gallbladder. No gallbladder wall thickening or infiltration. No bile duct dilatation. Pancreas: Mild pancreatic ductal dilatation. Calcifications in the head of the pancreas likely representing chronic pancreatitis. Cystic lesion in the tail of the pancreas measuring 2 cm diameter. No change since previous study. No peripancreatic infiltration or edema. Spleen: Normal in size without focal abnormality. Adrenals/Urinary Tract: No adrenal gland nodules. Kidneys are symmetrical. Nephrograms are homogeneous. No hydronephrosis or hydroureter. Bladder is normal. Stomach/Bowel: Stomach is decompressed, limiting evaluation but there is suggestion of gastric wall thickening particularly in the cardiac region. This  could indicate gastritis. Small bowel are mostly decompressed. Scattered stool throughout the colon without abnormal distention. Colonic diverticulosis without evidence of acute diverticulitis. No wall thickening or inflammatory stranding. Appendix is normal. Vascular/Lymphatic: Aortic atherosclerosis. No enlarged abdominal or pelvic lymph nodes. Reproductive: Prostate is unremarkable. Other: No abdominal wall hernia or abnormality. No abdominopelvic ascites. Musculoskeletal: No acute or significant osseous findings. IMPRESSION: 1. Nonspecific gastric wall thickening may be due to under distension or gastritis. Consider endoscopic evaluation if symptoms warrant. 2. Pancreatic calcification consistent with chronic pancreatitis. Mild pancreatic ductal dilatation. Cystic lesion in the tail of the pancreas is unchanged since prior study. Long-term stability suggests benign etiology. 3. Cholelithiasis without evidence of acute cholecystitis. 4. Aortic atherosclerosis. Electronically Signed   By: Elsie Gravely M.D.   On: 05/09/2024 19:47     Procedures   Medications Ordered in the ED  alum & mag hydroxide-simeth (MAALOX/MYLANTA) 200-200-20 MG/5ML suspension 30 mL (has no administration in time range)  iohexol  (OMNIPAQUE ) 350 MG/ML injection 75 mL (75 mLs Intravenous Contrast Given 05/09/24 1940)                                    Medical Decision Making Amount and/or Complexity of Data Reviewed Labs: ordered.  Risk Prescription drug management.   This patient presents to the ED for concern of epigastric pain, this involves a number of treatment options, and is a complaint that carries with it a high risk of complications and morbidity.  The differential diagnosis includes cholecystitis, reflux, renal stone, ACS.   Co morbidities: Discussed in HPI   Brief History:  See HPI.  EMR reviewed including pt PMHx, past surgical history and past visits to ER.   See HPI for more details   Lab  Tests:  I ordered and independently interpreted labs.  The pertinent results include:    Labs notable for CBC urinalysis, hemoglobin is within normal limits.  CMP with no electrolyte abnormality, creatinine level is at his baseline if not improved.  Lipase levels within normal limits.  UA no nitrates or leukocytes.  Imaging Studies:  CT abdomen and pelvis showed: IMPRESSION:  1. Nonspecific gastric wall thickening may be due to under  distension or gastritis. Consider endoscopic evaluation if symptoms  warrant.  2. Pancreatic calcification consistent with chronic pancreatitis.  Mild pancreatic ductal dilatation. Cystic lesion in the tail of the  pancreas is unchanged  since prior study. Long-term stability  suggests benign etiology.  3. Cholelithiasis without evidence of acute cholecystitis.  4. Aortic atherosclerosis.   Medicines ordered:  I ordered medication including GI cocktail  for symptomatic treatment Reevaluation of the patient after these medicines showed that the patient improved I have reviewed the patients home medicines and have made adjustments as needed  Reevaluation:  After the interventions noted above I re-evaluated patient and found that they have :improved  Social Determinants of Health:  The patient's social determinants of health were a factor in the care of this patient  Problem List / ED Course:  Patient presented to the ED with chief complaint of right flank pain that has been ongoing for the past week.  Exacerbated anytime he eats.  Also reporting some right flank pain radiating into the right upper quadrant.  On evaluation there is no tenderness along the right upper quadrant, he does tell me as sometimes the pain does get severe on the epigastric region radiating to the right upper quadrant.  He has not taken any medication for improvement in symptoms.  He has not had any nausea or vomiting.  His oral intake is the same. Blood work such as CBC is  without any leukocytosis, hemoglobin is within normal limits.  CMP without any electrolyte derangements, creatinine level is at his baseline.  LFTs are within normal limits, total bili is normal, although some suspicion for cholelithiasis, I do not suspect acute cholecystitis at this time.  Urinalysis with no nitrates, no leukocytes present. CT chest abdomen and pelvis obtained did not show any acute findings.  His vitals.  Within normal limits.  I do not suspect renal colic as there is no blood present in the urine, no stones visualized on CT scan. He did voice some decrease in stream when urinating, however does have a history of prostatitis.  I discussed this case my attending Dr. Simon who evaluated patient as well. We do feel that management for a likely symptomatic cholelithiasis versus reflux with H2 blockers along with close follow-up with PCP.  Hemodynamically stable for discharge.   Dispostion:  After consideration of the diagnostic results and the patients response to treatment, I feel that the patent would benefit from reflux treatment, will need close follow-up with PCP.  Portions of this note were generated with Scientist, clinical (histocompatibility and immunogenetics). Dictation errors may occur despite best attempts at proofreading.   Final diagnoses:  Epigastric pain    ED Discharge Orders          Ordered    omeprazole  (PRILOSEC) 20 MG capsule  Daily        05/09/24 2212               Tyese Finken, PA-C 05/09/24 2216    Simon Lavonia SAILOR, MD 05/09/24 2233

## 2024-05-09 NOTE — ED Triage Notes (Signed)
 Patient c/o abdominal pain, worse after eating and flank pain worse with certain movements x 2 weeks. Patient reports no abdominal surgeries or hx of kidney stones, no fevers or burning with urination but endorses decrease in stream when urinating.

## 2024-05-09 NOTE — ED Notes (Signed)
 Awaiting patient from lobby.

## 2024-05-09 NOTE — Discharge Instructions (Signed)
 You were given a prescription for omeprazole , please take 1 tablet daily for the next 14 days.  Please go to an appointment with Dr. Addie in order to follow-up for your ongoing pain.  If you experience any worsening symptoms please return to the emergency department

## 2024-05-22 DIAGNOSIS — E782 Mixed hyperlipidemia: Secondary | ICD-10-CM | POA: Diagnosis not present

## 2024-05-22 DIAGNOSIS — E559 Vitamin D deficiency, unspecified: Secondary | ICD-10-CM | POA: Diagnosis not present

## 2024-05-22 DIAGNOSIS — K2100 Gastro-esophageal reflux disease with esophagitis, without bleeding: Secondary | ICD-10-CM | POA: Diagnosis not present

## 2024-05-22 DIAGNOSIS — N1831 Chronic kidney disease, stage 3a: Secondary | ICD-10-CM | POA: Diagnosis not present

## 2024-05-22 DIAGNOSIS — M1712 Unilateral primary osteoarthritis, left knee: Secondary | ICD-10-CM | POA: Diagnosis not present

## 2024-05-22 DIAGNOSIS — E041 Nontoxic single thyroid nodule: Secondary | ICD-10-CM | POA: Diagnosis not present

## 2024-05-22 DIAGNOSIS — M50021 Cervical disc disorder at C4-C5 level with myelopathy: Secondary | ICD-10-CM | POA: Diagnosis not present

## 2024-05-22 DIAGNOSIS — Z23 Encounter for immunization: Secondary | ICD-10-CM | POA: Diagnosis not present

## 2024-05-22 DIAGNOSIS — E1122 Type 2 diabetes mellitus with diabetic chronic kidney disease: Secondary | ICD-10-CM | POA: Diagnosis not present

## 2024-06-27 ENCOUNTER — Other Ambulatory Visit: Payer: Self-pay | Admitting: Internal Medicine

## 2024-06-27 DIAGNOSIS — E041 Nontoxic single thyroid nodule: Secondary | ICD-10-CM

## 2024-07-02 ENCOUNTER — Emergency Department (HOSPITAL_COMMUNITY)
Admission: EM | Admit: 2024-07-02 | Discharge: 2024-07-02 | Disposition: A | Discharge status: Home / Self Care | Attending: Emergency Medicine | Admitting: Emergency Medicine

## 2024-07-02 ENCOUNTER — Other Ambulatory Visit: Payer: Self-pay

## 2024-07-02 DIAGNOSIS — E1165 Type 2 diabetes mellitus with hyperglycemia: Secondary | ICD-10-CM | POA: Insufficient documentation

## 2024-07-02 DIAGNOSIS — M545 Low back pain, unspecified: Secondary | ICD-10-CM | POA: Insufficient documentation

## 2024-07-02 DIAGNOSIS — Z79899 Other long term (current) drug therapy: Secondary | ICD-10-CM | POA: Diagnosis not present

## 2024-07-02 DIAGNOSIS — Z7984 Long term (current) use of oral hypoglycemic drugs: Secondary | ICD-10-CM | POA: Insufficient documentation

## 2024-07-02 DIAGNOSIS — I10 Essential (primary) hypertension: Secondary | ICD-10-CM | POA: Diagnosis not present

## 2024-07-02 DIAGNOSIS — R109 Unspecified abdominal pain: Secondary | ICD-10-CM | POA: Insufficient documentation

## 2024-07-02 DIAGNOSIS — Z7982 Long term (current) use of aspirin: Secondary | ICD-10-CM | POA: Diagnosis not present

## 2024-07-02 DIAGNOSIS — R10A1 Flank pain, right side: Secondary | ICD-10-CM

## 2024-07-02 DIAGNOSIS — Z794 Long term (current) use of insulin: Secondary | ICD-10-CM | POA: Diagnosis not present

## 2024-07-02 LAB — CBC
HCT: 33.4 % — ABNORMAL LOW (ref 39.0–52.0)
Hemoglobin: 10.7 g/dL — ABNORMAL LOW (ref 13.0–17.0)
MCH: 25.6 pg — ABNORMAL LOW (ref 26.0–34.0)
MCHC: 32 g/dL (ref 30.0–36.0)
MCV: 79.9 fL — ABNORMAL LOW (ref 80.0–100.0)
Platelets: 296 K/uL (ref 150–400)
RBC: 4.18 MIL/uL — ABNORMAL LOW (ref 4.22–5.81)
RDW: 13.6 % (ref 11.5–15.5)
WBC: 4.8 K/uL (ref 4.0–10.5)
nRBC: 0 % (ref 0.0–0.2)

## 2024-07-02 LAB — BASIC METABOLIC PANEL WITH GFR
Anion gap: 9 (ref 5–15)
BUN: 19 mg/dL (ref 8–23)
CO2: 23 mmol/L (ref 22–32)
Calcium: 8.8 mg/dL — ABNORMAL LOW (ref 8.9–10.3)
Chloride: 100 mmol/L (ref 98–111)
Creatinine, Ser: 1.49 mg/dL — ABNORMAL HIGH (ref 0.61–1.24)
GFR, Estimated: 48 mL/min — ABNORMAL LOW (ref >60)
Glucose, Bld: 311 mg/dL — ABNORMAL HIGH (ref 70–99)
Potassium: 4 mmol/L (ref 3.5–5.1)
Sodium: 132 mmol/L — ABNORMAL LOW (ref 135–145)

## 2024-07-02 LAB — HEPATIC FUNCTION PANEL
ALT: 14 U/L (ref 0–44)
AST: 23 U/L (ref 15–41)
Albumin: 3.2 g/dL — ABNORMAL LOW (ref 3.5–5.0)
Alkaline Phosphatase: 64 U/L (ref 38–126)
Bilirubin, Direct: <0.1 mg/dL (ref 0.0–0.2)
Total Bilirubin: 0.3 mg/dL (ref 0.0–1.2)
Total Protein: 6.5 g/dL (ref 6.5–8.1)

## 2024-07-02 LAB — URINALYSIS, ROUTINE W REFLEX MICROSCOPIC
Bilirubin Urine: NEGATIVE
Glucose, UA: 250 mg/dL — AB
Hgb urine dipstick: NEGATIVE
Ketones, ur: NEGATIVE mg/dL
Leukocytes,Ua: NEGATIVE
Nitrite: NEGATIVE
Protein, ur: NEGATIVE mg/dL
Specific Gravity, Urine: 1.015 (ref 1.005–1.030)
pH: 6 (ref 5.0–8.0)

## 2024-07-02 MED ORDER — ACETAMINOPHEN 500 MG PO TABS
1000.0000 mg | ORAL_TABLET | Freq: Once | ORAL | Status: AC
  Administered 2024-07-02: 1000 mg via ORAL
  Filled 2024-07-02: qty 2

## 2024-07-02 NOTE — ED Triage Notes (Signed)
 Pt reports right lower flank pain since last Tuesday. Pt says it takes a longer time to urinate than normal, denies blood in urine or painful urination. Denies fevers.

## 2024-07-02 NOTE — Discharge Instructions (Addendum)
 Evaluation for your right flank pain today was reassuring.  Please follow-up with your PCP.  You can take Tylenol  and apply ice in areas that hurt for pain.  If you develop worsening abdominal or flank pain, nausea vomiting and diarrhea, fever, blood in the urine or painful urination or any other concerning symptom please return to the ED for further evaluation.

## 2024-07-02 NOTE — ED Provider Notes (Signed)
 Jonathan Schultz Provider Note   CSN: [id number] Arrival date & time: 07/02/24  9443     Patient presents with: No chief complaint on file.  HPI Jonathan Schultz is a 78 y.o. male with type 2 diabetes, hypertension, hyperlipidemia presenting for right lower flank pain.  Started this past Tuesday.  It is worse with twisting of his torso.  He states he got a shot in his left arm and it started hurting shortly after.  Endorses a small stream of urine but no other urinary symptoms.  Denies nausea vomiting diarrhea.  Had a normal bowel movement.  Denies chest pain or shortness of breath.  He states he has had pain in that area before and underwent a CT scan for evaluation which showed he did have a gallstone.  Did not take his Tresiba and glipizide this morning.   HPI     Prior to Admission medications   Medication Sig Start Date End Date Taking? Authorizing Provider  ALPRAZolam (XANAX) 1 MG tablet Take 1 mg by mouth 3 (three) times daily as needed. 04/28/23   [provider]  amLODipine (NORVASC) 5 MG tablet Take 5 mg by mouth every morning.     [provider]  aspirin 81 MG chewable tablet Chew 81 mg by mouth daily.    [provider]  citalopram (CELEXA) 20 MG tablet Take 20 mg by mouth daily. 05/15/23   [provider]  fluticasone  (FLONASE ) 50 MCG/ACT nasal spray Place 2 sprays into both nostrils daily. Patient taking differently: Place 2 sprays into both nostrils daily as needed for allergies. 10/18/13   Vannie Sor, FNP  glipiZIDE (GLUCOTROL) 10 MG tablet Take 10 mg by mouth daily before breakfast.    [provider]  hydrOXYzine (ATARAX/VISTARIL) 10 MG tablet Take 10 mg by mouth at bedtime as needed for itching.    [provider]  magnesium oxide (MAG-OX) 400 (241.3 Mg) MG tablet Take 1 tablet by mouth 2 (two) times daily. Patient not taking: Reported on 05/31/2023 04/04/20   [provider]  meloxicam (MOBIC) 15 MG tablet Take 1 tablet by mouth daily. 12/16/14   [provider]  metoCLOPramide  (REGLAN ) 10 MG tablet Take 1 tablet by mouth 3 (three) times daily. 12/16/14   [provider]  metoprolol  (LOPRESSOR ) 50 MG tablet Take 25 mg by mouth daily.     [provider]  midodrine (PROAMATINE) 5 MG tablet Take 5 mg by mouth 2 (two) times daily. 04/28/23   [provider]  omeprazole  (PRILOSEC) 20 MG capsule Take 1 capsule (20 mg total) by mouth daily for 14 days. 05/09/24 05/23/24  Soto, Johana, PA-C  ONETOUCH ULTRA test strip 2 (two) times daily. 02/05/20   [provider]  oxyCODONE -acetaminophen  (PERCOCET/ROXICET) 5-325 MG tablet Take 1 tablet by mouth every 6 (six) hours as needed for severe pain. 04/15/23   Horton, Charmaine FALCON, MD  PARoxetine (PAXIL) 20 MG tablet Take 20 mg by mouth daily. 08/27/15   [provider]  phenol (CHLORASEPTIC) 1.4 % LIQD Use as directed 1 spray in the mouth or throat as needed for throat irritation / pain. 08/01/21   Couture, Cortni S, PA-C  rosuvastatin  (CRESTOR ) 10 MG tablet Take 1 tablet (10 mg total) by mouth daily. 06/14/23   Patwardhan, Newman PARAS, MD  tamsulosin (FLOMAX) 0.4 MG CAPS capsule Take 0.4 mg by mouth daily. 05/30/23   [provider]  TRESIBA FLEXTOUCH 200 UNIT/ML  FlexTouch Pen SMARTSIG:30 Unit(s) SUB-Q Daily 05/07/23   [provider]  UNABLE TO FIND This note is to excuse Jonathan Schultz from work due to medical illness requiring hospitalization from 3/23 to 3/24. 11/07/14   Joseph, Preetha, MD  Vitamin D, Ergocalciferol, (DRISDOL) 1.25 MG (50000 UNIT) CAPS capsule Take 50,000 Units by mouth once a week. 05/05/20   [provider]    Allergies: Penicillins    Review of Systems See HPI  Updated Vital Signs BP 107/81 (BP Location: Right Arm)   Pulse 61   Temp 97.6 F (36.4 C)   Resp 18   SpO2 95%   Physical Exam Vitals and nursing note reviewed.   HENT:     Head: Normocephalic and atraumatic.     Mouth/Throat:     Mouth: Mucous membranes are moist.  Eyes:     General:        Right eye: No discharge.        Left eye: No discharge.     Conjunctiva/sclera: Conjunctivae normal.  Cardiovascular:     Rate and Rhythm: Normal rate and regular rhythm.     Pulses: Normal pulses.     Heart sounds: Normal heart sounds.  Pulmonary:     Effort: Pulmonary effort is normal.     Breath sounds: Normal breath sounds.  Abdominal:     General: Abdomen is flat. There is no distension.     Palpations: Abdomen is soft.     Tenderness: There is no abdominal tenderness.  Musculoskeletal:     Comments: Able to actively flex and extend his back without pain.  There was some tenderness elicited in the right lower back with rotation of his torso.  Skin:    General: Skin is warm and dry.      Neurological:     General: No focal deficit present.  Psychiatric:        Mood and Affect: Mood normal.     (all labs ordered are listed, but only abnormal results are displayed) Labs Reviewed  URINALYSIS, ROUTINE W REFLEX MICROSCOPIC - Abnormal; Notable for the following components:      Result Value   Glucose, UA 250 (*)    All other components within normal limits  BASIC METABOLIC PANEL WITH GFR - Abnormal; Notable for the following components:   Sodium 132 (*)    Glucose, Bld 311 (*)    Creatinine, Ser 1.49 (*)    Calcium  8.8 (*)    GFR, Estimated 48 (*)    All other components within normal limits  CBC - Abnormal; Notable for the following components:   RBC 4.18 (*)    Hemoglobin 10.7 (*)    HCT 33.4 (*)    MCV 79.9 (*)    MCH 25.6 (*)    All other components within normal limits  HEPATIC FUNCTION PANEL - Abnormal; Notable for the following components:   Albumin 3.2 (*)    All other components within normal limits    EKG: None  Radiology: No results found.   Procedures   Medications Ordered in the ED  acetaminophen  (TYLENOL )  tablet 1,000 mg (1,000 mg Oral Given 07/02/24 1017)                                    Medical Decision Making Amount and/or Complexity of Data Reviewed Labs: ordered.   Initial Impression and Ddx 78 year old well-appearing male presenting for  right-sided flank pain.  Exam was unremarkable and pain was reproducible.  DDx includes kidney stone, pyelonephritis, acute cholecystitis, PE, MSK, other Patient PMH that increases complexity of ED encounter:  type 2 diabetes, hypertension, hyperlipidemia  Interpretation of Diagnostics - I independent reviewed and interpreted the labs as followed: Blood glucose 311, normal anion gap, hemoglobin 10.7, hepatic function panel unremarkable   Patient Reassessment and Ultimate Disposition/Management On reassessment pain had improved after Tylenol .  Suspect etiology is likely MSK.  Could be symptomatic cholelithiasis but given that pertinent labs are unremarkable and he is nontender on exam did not feel that further evaluation is warranted at this time.  Advised him to follow-up with his PCP.  Discussed return precautions.  Also was noted to be hyperglycemic here.  Suspect it is due to not taking his medications this morning.  Workup does not suggest DKA.  Advised him to take his Tresiba and insulin  this morning when he returned home.  Patient management required discussion with the following services or consulting groups:  None  Complexity of Problems Addressed Acute complicated illness or Injury  Additional Data Reviewed and Analyzed Further history obtained from: Past medical history and medications listed in the EMR and Prior ED visit notes  Patient Encounter Risk Assessment Consideration of hospitalization      Final diagnoses:  Right flank pain    ED Discharge Orders     None          Lang Norleen POUR, PA-C 07/02/24 1104    Levander Houston, MD 07/02/24 1540

## 2024-07-30 ENCOUNTER — Inpatient Hospital Stay
Admission: RE | Admit: 2024-07-30 | Discharge: 2024-07-30 | Disposition: A | Source: Ambulatory Visit | Attending: Internal Medicine | Admitting: Internal Medicine

## 2024-07-30 ENCOUNTER — Other Ambulatory Visit (HOSPITAL_COMMUNITY)
Admission: RE | Admit: 2024-07-30 | Discharge: 2024-07-30 | Disposition: A | Source: Ambulatory Visit | Attending: Interventional Radiology | Admitting: Interventional Radiology

## 2024-07-30 DIAGNOSIS — E041 Nontoxic single thyroid nodule: Secondary | ICD-10-CM | POA: Insufficient documentation

## 2024-08-01 LAB — CYTOLOGY - NON PAP
